# Patient Record
Sex: Female | Born: 1955 | Race: Black or African American | Hispanic: No | Marital: Married | Smoking: Never smoker
Health system: Southern US, Community
[De-identification: ages and names within clinical notes are randomized; demographics above are authoritative.]

## PROBLEM LIST (undated history)

## (undated) ENCOUNTER — Emergency Department (HOSPITAL_COMMUNITY): Admission: EM | Payer: Self-pay | Source: Home / Self Care

## (undated) DIAGNOSIS — R05 Cough: Secondary | ICD-10-CM

## (undated) DIAGNOSIS — R519 Headache, unspecified: Secondary | ICD-10-CM

## (undated) DIAGNOSIS — I10 Essential (primary) hypertension: Secondary | ICD-10-CM

## (undated) DIAGNOSIS — R011 Cardiac murmur, unspecified: Secondary | ICD-10-CM

## (undated) DIAGNOSIS — E7800 Pure hypercholesterolemia, unspecified: Secondary | ICD-10-CM

## (undated) DIAGNOSIS — R0789 Other chest pain: Secondary | ICD-10-CM

## (undated) DIAGNOSIS — R058 Other specified cough: Secondary | ICD-10-CM

## (undated) DIAGNOSIS — R51 Headache: Secondary | ICD-10-CM

## (undated) HISTORY — DX: Cardiac murmur, unspecified: R01.1

## (undated) HISTORY — DX: Cough: R05

## (undated) HISTORY — DX: Headache, unspecified: R51.9

## (undated) HISTORY — PX: CYSTECTOMY: SUR359

## (undated) HISTORY — DX: Other chest pain: R07.89

## (undated) HISTORY — DX: Headache: R51

## (undated) HISTORY — DX: Essential (primary) hypertension: I10

## (undated) HISTORY — DX: Other specified cough: R05.8

---

## 2001-09-03 ENCOUNTER — Encounter: Payer: Self-pay | Admitting: *Deleted

## 2001-09-03 ENCOUNTER — Emergency Department (HOSPITAL_COMMUNITY): Admission: EM | Admit: 2001-09-03 | Discharge: 2001-09-03 | Payer: Self-pay | Admitting: *Deleted

## 2005-10-01 ENCOUNTER — Ambulatory Visit (HOSPITAL_COMMUNITY): Admission: RE | Admit: 2005-10-01 | Discharge: 2005-10-01 | Payer: Self-pay | Admitting: Family Medicine

## 2006-02-24 ENCOUNTER — Ambulatory Visit: Payer: Self-pay

## 2006-08-02 ENCOUNTER — Ambulatory Visit (HOSPITAL_COMMUNITY): Admission: RE | Admit: 2006-08-02 | Discharge: 2006-08-02 | Payer: Self-pay | Admitting: Family Medicine

## 2007-05-02 ENCOUNTER — Ambulatory Visit: Payer: Self-pay

## 2007-05-31 ENCOUNTER — Ambulatory Visit: Payer: Self-pay | Admitting: Gastroenterology

## 2007-05-31 ENCOUNTER — Ambulatory Visit (HOSPITAL_COMMUNITY): Admission: RE | Admit: 2007-05-31 | Discharge: 2007-05-31 | Payer: Self-pay | Admitting: Gastroenterology

## 2007-06-19 ENCOUNTER — Other Ambulatory Visit: Admission: RE | Admit: 2007-06-19 | Discharge: 2007-06-19 | Payer: Self-pay | Admitting: Obstetrics and Gynecology

## 2009-06-18 ENCOUNTER — Ambulatory Visit: Payer: Self-pay

## 2011-02-09 NOTE — Op Note (Signed)
Shelley Bautista, Shelley Bautista               ACCOUNT NO.:  [id number]   MEDICAL RECORD NO.:  [id number]          PATIENT TYPE:  AMB   LOCATION:  DAY                           FACILITY:  [facility]   PHYSICIAN:  Kassie Mends, M.D.      DATE OF BIRTH:  1956/06/15   DATE OF PROCEDURE:  05/31/2007  DATE OF DISCHARGE:                               OPERATIVE REPORT   PROCEDURE:  Colonoscopy.   INDICATION FOR EXAM:  Shelley Bautista is a 55 year old female who presents  for average risk colon cancer screening.   FINDINGS:  1. Scattered diverticula throughout the colon.  Otherwise, no polyps,      masses, inflammatory changes, or AVMs.  2. Normal retroflexed view of the rectum.   DIAGNOSES:  Mild pancolonic diverticulosis.   RECOMMENDATIONS:  1. Screening colonoscopy in 10 years.  2. She should follow a high-fiber diet.  She was given a handout on      high-fiber diet and diverticulosis.   MEDICATIONS:  1. Demerol 75 mg IV.  2. Versed 4 mg IV.   PROCEDURE TECHNIQUE:  The physical exam was performed, and informed  consent was obtained from the patient after explaining the benefits,  risks and alternatives to the procedure.  The patient was connected to  the monitor and placed in the left lateral position.  Continuous oxygen  was provided by nasal cannula and IV medicine was administered through  an indwelling cannula.  After administration of sedation and rectal  exam, the patient's  rectum was intubated and the scope was advanced under direct  visualization to the cecum.  The scope was removed slowly by carefully  examining the color, texture, anatomy, and integrity of the mucosa on  the way out.  The patient was recovering in endoscopy and was discharged  home in satisfactory condition.      Kassie Mends, M.D.  Electronically Signed     SM/MEDQ  D:  05/31/2007  T:  05/31/2007  Job:  [id number]   cc:   Melony Overly, PA   Patrica Duel, M.D.  Fax: [phone]

## 2012-11-15 ENCOUNTER — Other Ambulatory Visit (HOSPITAL_COMMUNITY): Payer: Self-pay | Admitting: Family Medicine

## 2012-11-15 DIAGNOSIS — Z139 Encounter for screening, unspecified: Secondary | ICD-10-CM

## 2012-11-20 ENCOUNTER — Ambulatory Visit (HOSPITAL_COMMUNITY): Payer: Self-pay

## 2012-11-24 ENCOUNTER — Ambulatory Visit (HOSPITAL_COMMUNITY): Payer: Self-pay

## 2013-03-14 ENCOUNTER — Other Ambulatory Visit (HOSPITAL_COMMUNITY): Payer: Self-pay | Admitting: Internal Medicine

## 2013-03-14 DIAGNOSIS — Z139 Encounter for screening, unspecified: Secondary | ICD-10-CM

## 2013-03-19 ENCOUNTER — Ambulatory Visit (HOSPITAL_COMMUNITY): Payer: Self-pay

## 2013-10-01 ENCOUNTER — Other Ambulatory Visit (HOSPITAL_COMMUNITY): Payer: Self-pay | Admitting: Family Medicine

## 2013-10-01 DIAGNOSIS — Z1231 Encounter for screening mammogram for malignant neoplasm of breast: Secondary | ICD-10-CM

## 2013-10-04 ENCOUNTER — Ambulatory Visit (HOSPITAL_COMMUNITY)
Admission: RE | Admit: 2013-10-04 | Discharge: 2013-10-04 | Disposition: A | Discharge status: Home / Self Care | Payer: Managed Care, Other (non HMO) | Source: Ambulatory Visit | Attending: Family Medicine | Admitting: Family Medicine

## 2013-10-04 DIAGNOSIS — Z1231 Encounter for screening mammogram for malignant neoplasm of breast: Secondary | ICD-10-CM | POA: Insufficient documentation

## 2013-10-08 ENCOUNTER — Ambulatory Visit (HOSPITAL_COMMUNITY): Payer: Self-pay

## 2014-07-09 ENCOUNTER — Encounter (INDEPENDENT_AMBULATORY_CARE_PROVIDER_SITE_OTHER): Payer: Self-pay

## 2014-07-09 ENCOUNTER — Encounter: Payer: Self-pay | Admitting: Neurology

## 2014-07-09 ENCOUNTER — Ambulatory Visit (INDEPENDENT_AMBULATORY_CARE_PROVIDER_SITE_OTHER): Payer: Managed Care, Other (non HMO) | Admitting: Neurology

## 2014-07-09 VITALS — BP 141/84 | HR 67 | Ht 59.0 in | Wt 146.0 lb

## 2014-07-09 DIAGNOSIS — G609 Hereditary and idiopathic neuropathy, unspecified: Secondary | ICD-10-CM

## 2014-07-09 DIAGNOSIS — G43109 Migraine with aura, not intractable, without status migrainosus: Secondary | ICD-10-CM

## 2014-07-09 DIAGNOSIS — G453 Amaurosis fugax: Secondary | ICD-10-CM

## 2014-07-09 NOTE — Patient Instructions (Signed)
Overall you are doing fairly well but I do want to suggest a few things today:   Remember to drink plenty of fluid, eat healthy meals and do not skip any meals. Try to eat protein with a every meal and eat a healthy snack such as fruit or nuts in between meals. Try to keep a regular sleep-wake schedule and try to exercise daily, particularly in the form of walking, 20-30 minutes a day, if you can.   As far as your medications are concerned, I would like to suggest: Daily aspirin 81mg   As far as diagnostic testing: MRI and MRA to evaluate brain and arteries, labwork fasting  I would like to see you back after workup is complete, sooner if we need to. Please call us with any interim questions, concerns, problems, updates or refill requests.   Please also call us for any test results so we can go over those with you on the phone.  My clinical assistant and will answer any of your questions and relay your messages to me and also relay most of my messages to you.   Our phone number is [phone]. We also have an after hours call service for urgent matters and there is a physician on-call for urgent questions. For any emergencies you know to call 911 or go to the nearest emergency room

## 2014-07-09 NOTE — Progress Notes (Addendum)
[id number]    Provider:  Dr Jaynee Eagles Referring Provider: Johna Sheriff, MD Primary Care Physician:  No primary provider on file.  CC:  Vision changes  HPI:  Shelley Bautista is a 58 y.o. female here as a referral from Dr. Herbert Deaner for Vision changes  58 year old female PMHx HTN, DM who has some vision changes, like heat coming off of the road. The whole eye gets blurry. Lasts for 10 minutes. No pain, that is the only symptom. A lot of flashing lights, dots. She can't see when this happens, vision gets very blurry. No headache at the time. Last time was 3 weeks ago. Has happened 5x in the last few months. 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 Same at home. Happens in both eyes, never at the same time. Never loss of vision just very blurry to the point can't even read. Starts acutely but then worsens over the few minutes until very blurry. No double vision. No vision loss. No trauma. No jaw tightness, no pain in the shoulder or hip girdle. The last time she had a headache was last month, 4x a year, pounding behind the eyes, does get blurry but not the same, has light sensitivity but no sound sensitivity and no nausea. Headaches get bad, 10/10, they last for 3 days, they just go away at that point. OTC ibuprofen doesn't help. No weakness, no numbness or tingling, no dizzyness, no cp, not light headed during headaches.   Reviewed notes, labs and imaging from outside physicians, which showed: Opthamology consult demonstrated a normal dilated exam, possible glaucoma.  Review of Systems: Patient complains of symptoms per HPI as well as the following symptoms: blurred vision, eye pain, headache, sleepiness, snoring, shift work, joint pain, aching muscles, not enough sleep. Pertinent negatives per HPI. All others negative.   History   Social History  . Marital Status: Married    Spouse Name: Milbert Coulter    Number of Children: 4  . Years of  Education: GED   Occupational History  .      [employer]   Social History Main Topics  . Smoking status: Never Smoker   . Smokeless tobacco: Never Used  . Alcohol Use: Yes     Comment: 3 drinks  . Drug Use: No  . Sexual Activity: Not on file   Other Topics Concern  . Not on file   Social History Narrative   Patient lives at home with her family.   Caffeine use: 2 drinks daily    Family History  Problem Relation Age of Onset  . Cancer Mother   . Other Father     blood clot    Past Medical History  Diagnosis Date  . Hypertension     Past Surgical History  Procedure Laterality Date  . Cystectomy      Ovary    Current Outpatient Prescriptions  Medication Sig Dispense Refill  . estrogens, conjugated, (PREMARIN) 0.625 MG tablet Take 0.625 mg by mouth daily. Take daily for 21 days then do not take for 7 days.      Marland Kitchen losartan-hydrochlorothiazide (HYZAAR) 100-25 MG per tablet Take 1 tablet by mouth 2 (two) times daily.       No current facility-administered medications for this visit.    Allergies as of 07/09/2014  . (No Known Allergies)    Vitals: BP 141/84  Pulse 67  Ht 4\' 11"  (1.499 m)  Wt 146 lb (66.225 kg)  BMI 29.47 kg/m2 Last Weight:  Wt Readings from Last 1 Encounters:  07/09/14 146 lb (66.225 kg)   Last Height:   Ht Readings from Last 1 Encounters:  07/09/14 4\' 11"  (1.499 m)   Physical exam: Exam: Gen: NAD, conversant, well nourised, well groomed                     CV: RRR, no MRG. No Carotid Bruits. No peripheral edema, warm, nontender Eyes: Conjunctivae clear without exudates or hemorrhage  Neuro: Detailed Neurologic Exam  Speech:    Speech is normal; fluent and spontaneous with normal comprehension.  Cognition:    The patient is oriented to person, place, and time;     recent and remote memory intact;     language fluent;     normal attention, concentration,     fund of knowledge Cranial Nerves:    The pupils are equal,  round, and reactive to light. The fundi are normal and spontaneous venous pulsations are present. Visual fields are full to finger confrontation. Extraocular movements are intact. Trigeminal sensation is intact and the muscles of mastication are normal. The face is symmetric. The palate elevates in the midline. Voice is normal. Shoulder shrug is normal. The tongue has normal motion without fasciculations.   Coordination:    Normal finger to nose and heel to shin. Normal rapid alternating movements.   Gait:    Heel-toe and tandem gait are normal.   Motor Observation:    No asymmetry, no atrophy, and no involuntary movements noted. Tone:    Normal muscle tone.    Posture:    Posture is normal. normal erect    Strength:    Strength is V/V in the upper and lower limbs.      Sensation:   decrease distally to pp and temp  Reflex Exam:  DTR's:    Deep tendon reflexes in the upper and lower extremities are normal bilaterally.   Toes:    The toes are downgoing bilaterally.   Clonus:    Clonus is absent.   Assessment/Plan:  Patient with a PMHx of migraines, HTN and Transient binocular visual loss. Vision gets blurry and she sees stars, prisms. Likely is an aura without the migraine. Neuro exam normal, no papilledema. Since it is binocular (however never together at once) is less likely due to thromboembolism or ischemia however she does have vascular risk factors so will order labs as well as imaging to rule out stroke(MRI Brain, Carotid Dopplers). Labs for DM and HLD. No associated symptoms to suggest vertebrobasilar disease (no dizziness, vertigo). Need to rule out GCA as well. Will do a baseline  EKG as well. Will order a serum study for peripheral neuropathy. Advised her to start a daily baby aspirin.   Sarina Ill, MD  [facility] [address] [place], [place] [address]  Phone [phone] Fax [phone]

## 2014-07-12 ENCOUNTER — Encounter: Payer: Self-pay | Admitting: Neurology

## 2014-07-12 DIAGNOSIS — G453 Amaurosis fugax: Secondary | ICD-10-CM | POA: Insufficient documentation

## 2014-07-12 DIAGNOSIS — G43909 Migraine, unspecified, not intractable, without status migrainosus: Secondary | ICD-10-CM | POA: Insufficient documentation

## 2014-07-15 ENCOUNTER — Ambulatory Visit (HOSPITAL_COMMUNITY)
Admission: RE | Admit: 2014-07-15 | Discharge: 2014-07-15 | Disposition: A | Discharge status: Home / Self Care | Payer: Managed Care, Other (non HMO) | Source: Ambulatory Visit | Attending: Neurology | Admitting: Neurology

## 2014-07-15 ENCOUNTER — Telehealth: Payer: Self-pay | Admitting: Neurology

## 2014-07-15 DIAGNOSIS — I10 Essential (primary) hypertension: Secondary | ICD-10-CM | POA: Diagnosis not present

## 2014-07-15 DIAGNOSIS — G43909 Migraine, unspecified, not intractable, without status migrainosus: Secondary | ICD-10-CM | POA: Insufficient documentation

## 2014-07-15 DIAGNOSIS — G453 Amaurosis fugax: Secondary | ICD-10-CM | POA: Diagnosis not present

## 2014-07-15 NOTE — Telephone Encounter (Signed)
Dee from [facility] calling to state that patient is there for an MRA right now, but they see that there are 2 orders and they wanted to make sure they get it cleared up before patient leaves, please return call and advise.

## 2014-07-15 NOTE — Telephone Encounter (Signed)
Message was fwd to Dr. Jaynee Eagles via [place].

## 2016-05-18 DIAGNOSIS — Z1389 Encounter for screening for other disorder: Secondary | ICD-10-CM | POA: Diagnosis not present

## 2016-05-18 DIAGNOSIS — Z6827 Body mass index (BMI) 27.0-27.9, adult: Secondary | ICD-10-CM | POA: Diagnosis not present

## 2016-05-18 DIAGNOSIS — R591 Generalized enlarged lymph nodes: Secondary | ICD-10-CM | POA: Diagnosis not present

## 2016-05-18 DIAGNOSIS — G4700 Insomnia, unspecified: Secondary | ICD-10-CM | POA: Diagnosis not present

## 2016-05-18 DIAGNOSIS — E663 Overweight: Secondary | ICD-10-CM | POA: Diagnosis not present

## 2017-02-04 DIAGNOSIS — E782 Mixed hyperlipidemia: Secondary | ICD-10-CM | POA: Diagnosis not present

## 2017-02-04 DIAGNOSIS — F419 Anxiety disorder, unspecified: Secondary | ICD-10-CM | POA: Diagnosis not present

## 2017-02-04 DIAGNOSIS — Z1389 Encounter for screening for other disorder: Secondary | ICD-10-CM | POA: Diagnosis not present

## 2017-02-04 DIAGNOSIS — Z6830 Body mass index (BMI) 30.0-30.9, adult: Secondary | ICD-10-CM | POA: Diagnosis not present

## 2017-02-04 DIAGNOSIS — R2243 Localized swelling, mass and lump, lower limb, bilateral: Secondary | ICD-10-CM | POA: Diagnosis not present

## 2017-02-04 DIAGNOSIS — I10 Essential (primary) hypertension: Secondary | ICD-10-CM | POA: Diagnosis not present

## 2017-02-17 DIAGNOSIS — M722 Plantar fascial fibromatosis: Secondary | ICD-10-CM | POA: Diagnosis not present

## 2017-02-17 DIAGNOSIS — M79672 Pain in left foot: Secondary | ICD-10-CM | POA: Diagnosis not present

## 2017-02-17 DIAGNOSIS — M79671 Pain in right foot: Secondary | ICD-10-CM | POA: Diagnosis not present

## 2017-05-02 ENCOUNTER — Telehealth: Payer: Self-pay | Admitting: Gastroenterology

## 2017-05-02 NOTE — Telephone Encounter (Signed)
Sept recall for tcs °

## 2017-05-03 NOTE — Telephone Encounter (Signed)
Letter mailed to pt.  

## 2017-08-02 ENCOUNTER — Telehealth: Payer: Self-pay

## 2017-08-02 NOTE — Telephone Encounter (Signed)
Letter reminder for next colonoscopy returned. I called pt and updated her address. She is under the weather at this time, but will call when she is ready to schedule.

## 2017-08-26 DIAGNOSIS — R05 Cough: Secondary | ICD-10-CM | POA: Diagnosis not present

## 2017-08-26 DIAGNOSIS — Z6831 Body mass index (BMI) 31.0-31.9, adult: Secondary | ICD-10-CM | POA: Diagnosis not present

## 2017-08-26 DIAGNOSIS — Z1389 Encounter for screening for other disorder: Secondary | ICD-10-CM | POA: Diagnosis not present

## 2017-08-26 DIAGNOSIS — Z23 Encounter for immunization: Secondary | ICD-10-CM | POA: Diagnosis not present

## 2017-08-26 DIAGNOSIS — J8489 Other specified interstitial pulmonary diseases: Secondary | ICD-10-CM | POA: Diagnosis not present

## 2017-08-26 DIAGNOSIS — E6609 Other obesity due to excess calories: Secondary | ICD-10-CM | POA: Diagnosis not present

## 2017-11-15 DIAGNOSIS — Z1389 Encounter for screening for other disorder: Secondary | ICD-10-CM | POA: Diagnosis not present

## 2017-11-15 DIAGNOSIS — J209 Acute bronchitis, unspecified: Secondary | ICD-10-CM | POA: Diagnosis not present

## 2017-11-15 DIAGNOSIS — J22 Unspecified acute lower respiratory infection: Secondary | ICD-10-CM | POA: Diagnosis not present

## 2017-11-15 DIAGNOSIS — E6609 Other obesity due to excess calories: Secondary | ICD-10-CM | POA: Diagnosis not present

## 2017-11-15 DIAGNOSIS — Z6830 Body mass index (BMI) 30.0-30.9, adult: Secondary | ICD-10-CM | POA: Diagnosis not present

## 2017-11-25 DIAGNOSIS — M25512 Pain in left shoulder: Secondary | ICD-10-CM | POA: Diagnosis not present

## 2017-11-25 DIAGNOSIS — Z0001 Encounter for general adult medical examination with abnormal findings: Secondary | ICD-10-CM | POA: Diagnosis not present

## 2017-11-25 DIAGNOSIS — R011 Cardiac murmur, unspecified: Secondary | ICD-10-CM | POA: Diagnosis not present

## 2017-11-25 DIAGNOSIS — Z6830 Body mass index (BMI) 30.0-30.9, adult: Secondary | ICD-10-CM | POA: Diagnosis not present

## 2017-11-25 DIAGNOSIS — E6609 Other obesity due to excess calories: Secondary | ICD-10-CM | POA: Diagnosis not present

## 2017-11-25 DIAGNOSIS — Z1389 Encounter for screening for other disorder: Secondary | ICD-10-CM | POA: Diagnosis not present

## 2017-11-25 DIAGNOSIS — R05 Cough: Secondary | ICD-10-CM | POA: Diagnosis not present

## 2017-12-30 ENCOUNTER — Encounter: Payer: Self-pay | Admitting: Cardiology

## 2017-12-30 ENCOUNTER — Ambulatory Visit (INDEPENDENT_AMBULATORY_CARE_PROVIDER_SITE_OTHER): Payer: BLUE CROSS/BLUE SHIELD | Admitting: Cardiology

## 2017-12-30 VITALS — BP 138/92 | HR 74 | Ht 59.0 in | Wt 148.0 lb

## 2017-12-30 DIAGNOSIS — R011 Cardiac murmur, unspecified: Secondary | ICD-10-CM | POA: Diagnosis not present

## 2017-12-30 DIAGNOSIS — R55 Syncope and collapse: Secondary | ICD-10-CM | POA: Diagnosis not present

## 2017-12-30 NOTE — Patient Instructions (Signed)
Your physician recommends that you schedule a follow-up appointment in:  Pending after echo    Your physician has requested that you have an echocardiogram. Echocardiography is a painless test that uses sound waves to create images of your heart. It provides your doctor with information about the size and shape of your heart and how well your heart's chambers and valves are working. This procedure takes approximately one hour. There are no restrictions for this procedure.  Use over the counter Zantac 150 mg twice a day    No changes with your medications    No lab work today      Thank you for choosing [facility] !

## 2017-12-30 NOTE — Progress Notes (Signed)
Clinical Summary Shelley Bautista is a 62 y.o.female seen as new consult, referred by [facility] for heart murmur  1. Heart murmur - new diagnosis - reports some SOB at times with activitiy. 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 Burning like pain mid chest, 7/10 in severity. Can occur at any time. No other associated symptoms. Worst with deep breaths. Lasts a few minutes. Happens about 2-3 times a week. Has used antacid with some benefit. Often cough at night with laying down.  - can have some coughing/wheezing at times.    2,. Syncope - occurred at home while sitting. Had significant URI. Severe cough, stood up to go to bathroom and blacked out.     Past Medical History:  Diagnosis Date  . Headache   . Hypertension      No Known Allergies   Current Outpatient Medications  Medication Sig Dispense Refill  . estrogens, conjugated, (PREMARIN) 0.625 MG tablet Take 0.625 mg by mouth daily. Take daily for 21 days then do not take for 7 days.    Marland Kitchen losartan-hydrochlorothiazide (HYZAAR) 100-25 MG per tablet Take 1 tablet by mouth 2 (two) times daily.     No current facility-administered medications for this visit.      Past Surgical History:  Procedure Laterality Date  . CYSTECTOMY     Ovary     No Known Allergies    Family History  Problem Relation Age of Onset  . Cancer Mother   . Other Father        blood clot     Social History Shelley Bautista reports that she has never smoked. She has never used smokeless tobacco. Shelley Bautista reports that she drinks alcohol.   Review of Systems CONSTITUTIONAL: No weight loss, fever, chills, weakness or fatigue.  HEENT: Eyes: No visual loss, blurred vision, double vision or yellow sclerae.No hearing loss, sneezing, congestion, runny nose or sore throat.  SKIN: No rash or itching.  CARDIOVASCULAR: per hpi RESPIRATORY: No shortness of breath, cough or sputum.  GASTROINTESTINAL: No anorexia,  nausea, vomiting or diarrhea. No abdominal pain or blood.  GENITOURINARY: No burning on urination, no polyuria NEUROLOGICAL: per hpi  MUSCULOSKELETAL: No muscle, back pain, joint pain or stiffness.  LYMPHATICS: No enlarged nodes. No history of splenectomy.  PSYCHIATRIC: No history of depression or anxiety.  ENDOCRINOLOGIC: No reports of sweating, cold or heat intolerance. No polyuria or polydipsia.  Marland Kitchen   Physical Examination Vitals:   12/30/17 0847 12/30/17 0849  BP: (!) 146/88 (!) 138/92  Pulse: 74   SpO2: 98%    Vitals:   12/30/17 0847  Weight: 148 lb (67.1 kg)  Height: 4\' 11"  (1.499 m)    Gen: resting comfortably, no acute distress HEENT: no scleral icterus, pupils equal round and reactive, no palptable cervical adenopathy,  CV: RRR, 2/6 systolic murmur rusb, no jvd Resp: Clear to auscultation bilaterally GI: abdomen is soft, non-tender, non-distended, normal bowel sounds, no hepatosplenomegaly MSK: extremities are warm, no edema.  Skin: warm, no rash Neuro:  no focal deficits Psych: appropriate affect     Assessment and Plan  1. Heart murmur - we will obtain an echo to further evaluate  2. Syncope - post- tussive syncope, with likely orthostatic component in setting of URI - EKG in clinic today shows NSR - f/u echo due to heart murmur, no other workup planned at this time.   3. Chest pain - atypical, most consistent with  GERD. Recommend trial of OTC zantac.  - f/u echo results, do not plan on ischemic testing at this time.     Arnoldo Lenis, M.D.,

## 2018-01-02 ENCOUNTER — Ambulatory Visit (HOSPITAL_COMMUNITY)
Admission: RE | Admit: 2018-01-02 | Discharge: 2018-01-02 | Disposition: A | Discharge status: Home / Self Care | Payer: BLUE CROSS/BLUE SHIELD | Source: Ambulatory Visit | Attending: Cardiology | Admitting: Cardiology

## 2018-01-02 DIAGNOSIS — I10 Essential (primary) hypertension: Secondary | ICD-10-CM | POA: Diagnosis not present

## 2018-01-02 DIAGNOSIS — G453 Amaurosis fugax: Secondary | ICD-10-CM | POA: Diagnosis not present

## 2018-01-02 DIAGNOSIS — R011 Cardiac murmur, unspecified: Secondary | ICD-10-CM | POA: Diagnosis not present

## 2018-01-02 NOTE — Progress Notes (Signed)
*  PRELIMINARY RESULTS* Echocardiogram 2D Echocardiogram has been performed.  Shelley Bautista 01/02/2018, 9:08 AM

## 2018-01-03 ENCOUNTER — Emergency Department (HOSPITAL_COMMUNITY)
Admission: EM | Admit: 2018-01-03 | Discharge: 2018-01-03 | Disposition: A | Discharge status: Home / Self Care | Payer: BLUE CROSS/BLUE SHIELD | Attending: Emergency Medicine | Admitting: Emergency Medicine

## 2018-01-03 ENCOUNTER — Encounter (HOSPITAL_COMMUNITY): Payer: Self-pay | Admitting: Emergency Medicine

## 2018-01-03 ENCOUNTER — Emergency Department (HOSPITAL_COMMUNITY): Payer: BLUE CROSS/BLUE SHIELD

## 2018-01-03 ENCOUNTER — Other Ambulatory Visit: Payer: Self-pay

## 2018-01-03 DIAGNOSIS — I10 Essential (primary) hypertension: Secondary | ICD-10-CM | POA: Diagnosis not present

## 2018-01-03 DIAGNOSIS — R079 Chest pain, unspecified: Secondary | ICD-10-CM | POA: Diagnosis not present

## 2018-01-03 DIAGNOSIS — R0602 Shortness of breath: Secondary | ICD-10-CM | POA: Diagnosis not present

## 2018-01-03 DIAGNOSIS — Z79899 Other long term (current) drug therapy: Secondary | ICD-10-CM | POA: Insufficient documentation

## 2018-01-03 DIAGNOSIS — R0789 Other chest pain: Secondary | ICD-10-CM | POA: Diagnosis not present

## 2018-01-03 DIAGNOSIS — N644 Mastodynia: Secondary | ICD-10-CM | POA: Diagnosis not present

## 2018-01-03 LAB — COMPREHENSIVE METABOLIC PANEL
ALBUMIN: 3.3 g/dL — AB (ref 3.5–5.0)
ALK PHOS: 53 U/L (ref 38–126)
ALT: 35 U/L (ref 14–54)
AST: 22 U/L (ref 15–41)
Anion gap: 9 (ref 5–15)
BUN: 20 mg/dL (ref 6–20)
CALCIUM: 8.4 mg/dL — AB (ref 8.9–10.3)
CHLORIDE: 105 mmol/L (ref 101–111)
CO2: 25 mmol/L (ref 22–32)
CREATININE: 0.87 mg/dL (ref 0.44–1.00)
GFR calc non Af Amer: >60 mL/min (ref >60)
GLUCOSE: 108 mg/dL — AB (ref 65–99)
Potassium: 3.5 mmol/L (ref 3.5–5.1)
SODIUM: 139 mmol/L (ref 135–145)
Total Bilirubin: 0.5 mg/dL (ref 0.3–1.2)
Total Protein: 6.6 g/dL (ref 6.5–8.1)

## 2018-01-03 LAB — CBC WITH DIFFERENTIAL/PLATELET
BASOS PCT: 0 %
Basophils Absolute: 0 10*3/uL (ref 0.0–0.1)
EOS ABS: 0.1 10*3/uL (ref 0.0–0.7)
Eosinophils Relative: 1 %
HCT: 37.4 % (ref 36.0–46.0)
HEMOGLOBIN: 12.1 g/dL (ref 12.0–15.0)
LYMPHS ABS: 1.7 10*3/uL (ref 0.7–4.0)
Lymphocytes Relative: 27 %
MCH: 29.7 pg (ref 26.0–34.0)
MCHC: 32.4 g/dL (ref 30.0–36.0)
MCV: 91.9 fL (ref 78.0–100.0)
MONO ABS: 0.5 10*3/uL (ref 0.1–1.0)
MONOS PCT: 8 %
NEUTROS PCT: 64 %
Neutro Abs: 3.9 10*3/uL (ref 1.7–7.7)
Platelets: 266 10*3/uL (ref 150–400)
RBC: 4.07 MIL/uL (ref 3.87–5.11)
RDW: 13.5 % (ref 11.5–15.5)
WBC: 6.1 10*3/uL (ref 4.0–10.5)

## 2018-01-03 LAB — TROPONIN I: Troponin I: <0.03 ng/mL (ref <0.03)

## 2018-01-03 LAB — D-DIMER, QUANTITATIVE (NOT AT ARMC)

## 2018-01-03 MED ORDER — NITROGLYCERIN 0.4 MG SL SUBL: 0.4000 mg | SUBLINGUAL_TABLET | SUBLINGUAL | 0 refills | Status: AC | PRN

## 2018-01-03 NOTE — ED Triage Notes (Addendum)
While at work pt had sudden onset of left sided chest pain. She denies any chest pain at this time. Pt was given 324mg  of aspirin and 1 nitro by ems.

## 2018-01-03 NOTE — ED Provider Notes (Signed)
[facility] EMERGENCY DEPARTMENT Provider Note   CSN: [id number] Arrival date & time: 01/03/18  0147  Time seen 02:10 AM   History   Chief Complaint Chief Complaint  Patient presents with  . Chest Pain    HPI Shelley Bautista is a 62 y.o. female.  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  She states in total the pain lasted about 15 minutes.  She states she was still having the pain on EMS arrival and within 1-2 minutes of her receiving nitroglycerin the pain was totally gone and is still gone.  She had some mild shortness of breath but denies diaphoresis, nausea, vomiting, or radiation of the pain.  She states she did get shaky and felt cold when she had the pain.  She denies any [place] or swelling of her legs.  She states she has never had this pain before.  She denies any recent traveling.  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  She states nothing was different today other than she was recently noted to have a heart murmur on her physical about 2 months ago.  She is been evaluated by Dr. Harl Bowie, cardiologist and had a echocardiogram done this morning when she got off work.  She denies being stressed out about the results.  Her risk factors for coronary artery disease are hypertension and high cholesterol.  She however does not have diabetes, she does not smoke, and she has no family history coronary artery disease.  [facility] Cardiology Dr Harl Bowie  Past Medical History:  Diagnosis Date  . Headache   . Hypertension     Patient Active Problem List   Diagnosis Date Noted  . 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 Migraine 07/12/2014    Past Surgical History:  Procedure Laterality Date  . CYSTECTOMY     Ovary      OB History   None      Home Medications    Prior to Admission medications   Medication Sig Start Date End Date Taking? Authorizing Provider  amLODipine (NORVASC) 2.5 MG tablet Take 2.5 mg by mouth daily. 09/27/17   [provider]  atorvastatin (LIPITOR) 20 MG tablet Take 20 mg by mouth daily. 11/25/17   [provider]  estrogens, conjugated, (PREMARIN) 0.625 MG tablet Take 0.625 mg by mouth daily. Take daily for 21 days then do not take for 7 days.    [provider]  losartan-hydrochlorothiazide (HYZAAR) 100-25 MG per tablet Take 1 tablet by mouth 2 (two) times daily.    [provider]  nitroGLYCERIN (NITROSTAT) 0.4 MG SL tablet Place 1 tablet (0.4 mg total) under the tongue every 5 (five) minutes as needed for chest pain. 01/03/18   Rolland Porter, MD    Family History Family History  Problem Relation Age of Onset  . Cancer Mother   . Other Father        blood clot    Social History Social History   Tobacco Use  . Smoking status: Never Smoker  . Smokeless tobacco: Never Used  Substance Use Topics  . Alcohol use: Yes    Comment: 3 drinks  . Drug use: No  employed   Allergies  Patient has no known allergies.   Review of Systems Review of Systems  All other systems reviewed and are negative.    Physical Exam Updated Vital Signs BP (!) 141/82   Pulse 64   Temp 98.3 F (36.8 C)   Resp 16   Ht 4\' 11"  (1.499 m)   Wt 67.1 kg (148 lb)   SpO2 98%   BMI 29.89 kg/m   Vital signs normal    Physical Exam  Constitutional: She is oriented to person, place, and time. She appears well-developed and well-nourished.  Non-toxic appearance. She does not appear ill. No distress.  HENT:  Head: Normocephalic and atraumatic.  Right Ear: External ear normal.  Left Ear: External ear normal.  Nose: Nose normal. No mucosal edema or rhinorrhea.  Mouth/Throat: Oropharynx is clear and moist and mucous membranes are normal. No dental  abscesses or uvula swelling.  Eyes: Pupils are equal, round, and reactive to light. Conjunctivae and EOM are normal.  Neck: Normal range of motion and full passive range of motion without pain. Neck supple.  Cardiovascular: Normal rate, regular rhythm and normal heart sounds. Exam reveals no gallop and no friction rub.  No murmur heard. Pulmonary/Chest: Effort normal and breath sounds normal. No respiratory distress. She has no wheezes. She has no rhonchi. She has no rales. She exhibits no tenderness and no crepitus.  Area of chest pain    Abdominal: Soft. Normal appearance and bowel sounds are normal. She exhibits no distension. There is no tenderness. There is no rebound and no guarding.  Musculoskeletal: Normal range of motion. She exhibits no edema or tenderness.  Moves all extremities well.   Neurological: She is alert and oriented to person, place, and time. She has normal strength. No cranial nerve deficit.  Skin: Skin is warm, dry and intact. No rash noted. No erythema. No pallor.  Psychiatric: She has a normal mood and affect. Her speech is normal and behavior is normal. Her mood appears not anxious.  Nursing note and vitals reviewed.    ED Treatments / Results  Labs (all labs ordered are listed, but only abnormal results are displayed) Results for orders placed or performed during the hospital encounter of 01/03/18  Comprehensive metabolic panel  Result Value Ref Range   Sodium 139 135 - 145 mmol/L   Potassium 3.5 3.5 - 5.1 mmol/L   Chloride 105 101 - 111 mmol/L   CO2 25 22 - 32 mmol/L   Glucose, Bld 108 (H) 65 - 99 mg/dL   BUN 20 6 - 20 mg/dL   Creatinine, Ser 0.87 0.44 - 1.00 mg/dL   Calcium 8.4 (L) 8.9 - 10.3 mg/dL   Total Protein 6.6 6.5 - 8.1 g/dL   Albumin 3.3 (L) 3.5 - 5.0 g/dL   AST 22 15 - 41 U/L   ALT 35 14 - 54 U/L   Alkaline Phosphatase 53 38 - 126 U/L   Total Bilirubin 0.5 0.3 - 1.2 mg/dL   GFR calc non Af Amer >60 >60 mL/min   GFR calc Af Amer >60 >60  mL/min   Anion gap 9 5 - 15  CBC with Differential  Result Value Ref Range   WBC 6.1 4.0 - 10.5 K/uL   RBC 4.07 3.87 - 5.11 MIL/uL   Hemoglobin 12.1 12.0 - 15.0 g/dL   HCT 37.4 36.0 - 46.0 %   MCV 91.9 78.0 - 100.0 fL   MCH 29.7 26.0 - 34.0 pg   MCHC 32.4 30.0 - 36.0  g/dL   RDW 13.5 11.5 - 15.5 %   Platelets 266 150 - 400 K/uL   Neutrophils Relative % 64 %   Neutro Abs 3.9 1.7 - 7.7 K/uL   Lymphocytes Relative 27 %   Lymphs Abs 1.7 0.7 - 4.0 K/uL   Monocytes Relative 8 %   Monocytes Absolute 0.5 0.1 - 1.0 K/uL   Eosinophils Relative 1 %   Eosinophils Absolute 0.1 0.0 - 0.7 K/uL   Basophils Relative 0 %   Basophils Absolute 0.0 0.0 - 0.1 K/uL  Troponin I  Result Value Ref Range   Troponin I <0.03 <0.03 ng/mL  D-dimer, quantitative  Result Value Ref Range   D-Dimer, Quant <0.27 0.00 - 0.50 ug/mL-FEU  Troponin I  Result Value Ref Range   Troponin I <0.03 <0.03 ng/mL    Laboratory interpretation all normal     EKG EKG Interpretation  Date/Time:  Tuesday January 03 2018 01:58:37 EDT Ventricular Rate:  70 PR Interval:    QRS Duration: 90 QT Interval:  439 QTC Calculation: 474 R Axis:   -6 Text Interpretation:  Sinus rhythm Baseline wander Normal ECG No old tracing to compare Confirmed by Rolland Porter [phone]) on 01/03/2018 2:07:11 AM   Radiology Dg Chest Port 1 View  Result Date: 01/03/2018 CLINICAL DATA:  Sudden onset left-sided chest pain. EXAM: PORTABLE CHEST 1 VIEW COMPARISON:  08/17/2011 FINDINGS: The heart size and mediastinal contours are within normal limits. Both lungs are clear. The visualized skeletal structures are unremarkable. IMPRESSION: No active disease. Electronically Signed   By: Ashley Royalty M.D.   On: 01/03/2018 03:50    Procedures Procedures (including critical care time)   Echocardiogram January 02, 2018 Study Conclusions  - Left ventricle: The cavity size was normal. Wall thickness was   normal. Systolic function was normal. The estimated  ejection   fraction was in the range of 55% to 60%. Wall motion was normal;   there were no regional wall motion abnormalities. Doppler   parameters are consistent with abnormal left ventricular   relaxation (grade 1 diastolic dysfunction). - Aortic valve: Mildly calcified annulus. Trileaflet. There was   mild regurgitation. - Mitral valve: Mildly calcified annulus. There was trivial   regurgitation. - Right atrium: Central venous pressure (est): 3 mm Hg. - Tricuspid valve: There was mild regurgitation. - Pulmonary arteries: PA peak pressure: 29 mm Hg (S). - Pericardium, extracardiac: There was no pericardial effusion.  Medications Ordered in ED Medications - No data to display   Initial Impression / Assessment and Plan / ED Course  I have reviewed the triage vital signs and the nursing notes.  Pertinent labs & imaging results that were available during my care of the patient were reviewed by me and considered in my medical decision making (see chart for details).     Laboratory testing was ordered and portable chest x-ray.  I instructed the patient to let the nurse know if her pain returns.  D-dimer was done to screen for PE.  Troponin was done to screen for myocardial ischemia.  Chest x-ray was done to rule out other possibilities such as pneumonia or widened aorta such as dissection.  EKG was done without acute changes.  Check at 4:45 AM patient is sleeping.  She states she has had no further episodes of chest pain.  We discussed her test results including her echocardiogram.  We discussed getting a delta troponin which will be after 5 AM, that would be 4 hours after the  onset of her chest pain.  If that is negative she will be discharged home.  6 AM patient's delta troponin is negative.  She was discharged home with nitroglycerin since she states it helped tonight.  She has already been seen by cardiology and she was advised to call his office this morning and that them know she had  chest pain which is a new symptom and had to come to the ED tonight.  They should probably check her soon this week in the office.  She was advised if she got chest pain again to sit down and take the nitroglycerin and called 911 to transport her back to the ED.  Final Clinical Impressions(s) / ED Diagnoses   Final diagnoses:  Chest pain, unspecified type    ED Discharge Orders        Ordered    nitroGLYCERIN (NITROSTAT) 0.4 MG SL tablet  Every 5 min PRN     01/03/18 0558      Plan discharge  Rolland Porter, MD, Barbette Or, MD 01/03/18 [phone]

## 2018-01-03 NOTE — Discharge Instructions (Addendum)
Please call Dr Nelly Laurence office this morning to let him know about your chest pain and your ED visit. If you get the chest pain again, sit down and put 1 of the nitroglycerin pills under your tongue and call 911.

## 2018-01-04 ENCOUNTER — Encounter: Payer: Self-pay | Admitting: Cardiology

## 2018-01-05 ENCOUNTER — Telehealth: Payer: Self-pay

## 2018-01-05 NOTE — Telephone Encounter (Signed)
-----   Message from Arnoldo Lenis, MD sent at 01/05/2018  2:00 PM EDT ----- Echo overall looks good. She has one heart valve (her mitral valve) that is just mildly leaky that creates a murmur, this is nothing to be worried about and is very common. I read about her episode of chest pain and ER visit, testing there looked ok. I'd like her to continue taking the antacid we discussed and f/u in 2-3 weeks with PA, if ongoing symptoms would consider stress test at that time.   Zandra Abts MD

## 2018-01-05 NOTE — Telephone Encounter (Signed)
Called pt. No answer. Left message for pt to return call.  

## 2018-01-09 ENCOUNTER — Telehealth: Payer: Self-pay

## 2018-01-09 NOTE — Telephone Encounter (Signed)
-----   Message from Arnoldo Lenis, MD sent at 01/05/2018  2:00 PM EDT ----- Echo overall looks good. She has one heart valve (her mitral valve) that is just mildly leaky that creates a murmur, this is nothing to be worried about and is very common. I read about her episode of chest pain and ER visit, testing there looked ok. I'd like her to continue taking the antacid we discussed and f/u in 2-3 weeks with PA, if ongoing symptoms would consider stress test at that time.   Zandra Abts MD

## 2018-01-09 NOTE — Telephone Encounter (Signed)
Will mail letter informing pt of results. Unable to reach by phone. Pt not on MyChart.

## 2018-01-09 NOTE — Telephone Encounter (Signed)
Called pt., no answer. Left message for pt to return call.  

## 2018-01-24 NOTE — Progress Notes (Deleted)
Cardiology Office Note    Date:  01/24/2018   ID:  Shelley Bautista, DOB 07/01/1956, MRN [id number]  PCP:  [facility]  Cardiologist: Carlyle Dolly, MD    No chief complaint on file.   History of Present Illness:    Shelley Bautista is a 62 y.o. female with past medical history of hypertension and syncope who presents to the office today for 3-week follow-up.  She was examined by Dr. Harl Bowie is a new patient on 12/30/2017 after being found to have a new heart murmur.  She reported some dyspnea on exertion and had experienced episodes of chest discomfort which was usually worse with deep breathing and would resolve with Tums. An echocardiogram was obtained for initial evaluation which showed a preserved EF of 55 to 60%, no regional wall motion abnormalities, grade 1 diastolic dysfunction, mild AI, and trivial MR. It was recommended she follow-up within several weeks and consider a stress test if still having symptoms.   Past Medical History:  Diagnosis Date  . Headache   . Hypertension     Past Surgical History:  Procedure Laterality Date  . CYSTECTOMY     Ovary    Current Medications: Outpatient Medications Prior to Visit  Medication Sig Dispense Refill  . amLODipine (NORVASC) 2.5 MG tablet Take 2.5 mg by mouth daily.  11  . atorvastatin (LIPITOR) 20 MG tablet Take 20 mg by mouth daily.  1  . estrogens, conjugated, (PREMARIN) 0.625 MG tablet Take 0.625 mg by mouth daily. Take daily for 21 days then do not take for 7 days.    Marland Kitchen losartan-hydrochlorothiazide (HYZAAR) 100-25 MG per tablet Take 1 tablet by mouth 2 (two) times daily.    . nitroGLYCERIN (NITROSTAT) 0.4 MG SL tablet Place 1 tablet (0.4 mg total) under the tongue every 5 (five) minutes as needed for chest pain. 30 tablet 0   No facility-administered medications prior to visit.      Allergies:   Patient has no known allergies.   Social History   Socioeconomic History  . Marital status:  Married    Spouse name: Milbert Coulter  . Number of children: 4  . Years of education: GED  . Highest education level: Not on file  Occupational History    Employer: [employer]    Comment: Pension scheme manager  Social Needs  . Financial resource strain: Not on file  . Food insecurity:    Worry: Not on file    Inability: Not on file  . Transportation needs:    Medical: Not on file    Non-medical: Not on file  Tobacco Use  . Smoking status: Never Smoker  . Smokeless tobacco: Never Used  Substance and Sexual Activity  . Alcohol use: Yes    Comment: 3 drinks  . Drug use: No  . Sexual activity: Not on file  Lifestyle  . Physical activity:    Days per week: Not on file    Minutes per session: Not on file  . Stress: Not on file  Relationships  . Social connections:    Talks on phone: Not on file    Gets together: Not on file    Attends religious service: Not on file    Active member of club or organization: Not on file    Attends meetings of clubs or organizations: Not on file    Relationship status: Not on file  Other Topics Concern  . Not on file  Social History Narrative  Patient lives at home with her family.   Caffeine use: 2 drinks daily     Family History:  The patient's ***family history includes Cancer in her mother; Other in her father.   Review of Systems:   Please see the history of present illness.     General:  No chills, fever, night sweats or weight changes.  Cardiovascular:  No chest pain, dyspnea on exertion, edema, orthopnea, palpitations, paroxysmal nocturnal dyspnea. Dermatological: No rash, lesions/masses Respiratory: No cough, dyspnea Urologic: No hematuria, dysuria Abdominal:   No nausea, vomiting, diarrhea, bright red blood per rectum, melena, or hematemesis Neurologic:  No visual changes, wkns, changes in mental status. All other systems reviewed and are otherwise negative except as noted above.   Physical Exam:    VS:  There were no vitals taken  for this visit.   General: Well developed, well nourished,female appearing in no acute distress. Head: Normocephalic, atraumatic, sclera non-icteric, no xanthomas, nares are without discharge.  Neck: No carotid bruits. JVD not elevated.  Lungs: Respirations regular and unlabored, without wheezes or rales.  Heart: ***Regular rate and rhythm. No S3 or S4.  No murmur, no rubs, or gallops appreciated. Abdomen: Soft, non-tender, non-distended with normoactive bowel sounds. No hepatomegaly. No rebound/guarding. No obvious abdominal masses. Msk:  Strength and tone appear normal for age. No joint deformities or effusions. Extremities: No clubbing or cyanosis. No edema.  Distal pedal pulses are 2+ bilaterally. Neuro: Alert and oriented X 3. Moves all extremities spontaneously. No focal deficits noted. Psych:  Responds to questions appropriately with a normal affect. Skin: No rashes or lesions noted  Wt Readings from Last 3 Encounters:  01/03/18 148 lb (67.1 kg)  12/30/17 148 lb (67.1 kg)  07/09/14 146 lb (66.2 kg)        Studies/Labs Reviewed:   EKG:  EKG is*** ordered today.  The ekg ordered today demonstrates ***  Recent Labs: 01/03/2018: ALT 35; BUN 20; Creatinine, Ser 0.87; Hemoglobin 12.1; Platelets 266; Potassium 3.5; Sodium 139   Lipid Panel No results found for: CHOL, TRIG, HDL, CHOLHDL, VLDL, LDLCALC, LDLDIRECT  Additional studies/ records that were reviewed today include:   Echocardiogram: 01/05/2018 Study Conclusions  - Left ventricle: The cavity size was normal. Wall thickness was   normal. Systolic function was normal. The estimated ejection   fraction was in the range of 55% to 60%. Wall motion was normal;   there were no regional wall motion abnormalities. Doppler   parameters are consistent with abnormal left ventricular   relaxation (grade 1 diastolic dysfunction). - Aortic valve: Mildly calcified annulus. Trileaflet. There was   mild regurgitation. - Mitral valve:  Mildly calcified annulus. There was trivial   regurgitation. - Right atrium: Central venous pressure (est): 3 mm Hg. - Tricuspid valve: There was mild regurgitation. - Pulmonary arteries: PA peak pressure: 29 mm Hg (S). - Pericardium, extracardiac: There was no pericardial effusion.  Assessment:    No diagnosis found.   Plan:   In order of problems listed above:  1. ***    Medication Adjustments/Labs and Tests Ordered: Current medicines are reviewed at length with the patient today.  Concerns regarding medicines are outlined above.  Medication changes, Labs and Tests ordered today are listed in the Patient Instructions below. There are no Patient Instructions on file for this visit.   Signed, Erma Heritage, PA-C  01/24/2018 1:15 PM    [facility] [address] [place], [place] [address] Phone: [phone]

## 2018-01-25 ENCOUNTER — Ambulatory Visit: Payer: BLUE CROSS/BLUE SHIELD | Admitting: Student

## 2018-02-09 ENCOUNTER — Encounter: Payer: Self-pay | Admitting: Physician Assistant

## 2018-02-09 DIAGNOSIS — R011 Cardiac murmur, unspecified: Secondary | ICD-10-CM | POA: Insufficient documentation

## 2018-02-09 DIAGNOSIS — I10 Essential (primary) hypertension: Secondary | ICD-10-CM | POA: Insufficient documentation

## 2018-02-09 DIAGNOSIS — R05 Cough: Secondary | ICD-10-CM | POA: Insufficient documentation

## 2018-02-09 DIAGNOSIS — R058 Other specified cough: Secondary | ICD-10-CM | POA: Insufficient documentation

## 2018-02-09 NOTE — Progress Notes (Signed)
Cardiology Office Note    Date:  02/10/2018  ID:  Shelley Bautista, DOB 1955/10/07, MRN [id number] PCP:  [facility]  Cardiologist:  Dr. Harl Bowie  Chief Complaint: f/u chest pain  History of Present Illness:  Shelley Bautista is a 62 y.o. female with history of HTN, headaches, estrogen replacement therapy was recently seen as a new patient by Dr. Harl Bowie 12/30/17 with heart murmur, atypical chest pain and syncope. She had reported chest burning 2-3x a week, worse with deep breathing, lasting a few minutes, occurring at any time, with a cough at night. She also had had an episode of syncope while sitting - had significant URI, had a severe cough, stood up to go to the bathroom and blacked out. 2D Echo 01/02/18 showed EF 55-60%, grade 1 DD, mild AI, trivial MR/TR, PASP 68mmHg, CVP 3. Syncope was felt to be post-tussive with likely orthostatic nature in setting of URI. Chest pain was felt atypical and OTC Zantac was recommended. She was seen back in the ER 01/03/18 for sharp stabbing chest pain. D dimer and troponins were negative. CBC wnl. CMET showed K 3.5, Cr 0.87, albumin 3.3, glucose 108.  She returns for follow-up today feeling great. She has not had any recurrent chest pain or indigestion since starting Zantac. She went back to work the next day after her ER episode and has not had any further chest pain including with physical laborious exertion. No dyspnea. She checks her BP occasionally at home and it can sometimes spike into the 140s.   Past Medical History:  Diagnosis Date  . Atypical chest pain   . Headache   . Heart murmur    a. benign -  2D Echo 01/02/18 showed EF 55-60%, grade 1 DD, mild AI, trivial MR/TR, PASP 24mmHg, CVP 3.   . Hypertension   . Post-tussive syncope     Past Surgical History:  Procedure Laterality Date  . CYSTECTOMY     Ovary    Current Medications: Current Meds  Medication Sig  . amLODipine (NORVASC) 2.5 MG tablet Take 2.5 mg by mouth daily.    Marland Kitchen atorvastatin (LIPITOR) 20 MG tablet Take 20 mg by mouth daily.  Marland Kitchen estrogens, conjugated, (PREMARIN) 0.625 MG tablet Take 0.625 mg by mouth daily. Take daily for 21 days then do not take for 7 days.  Marland Kitchen losartan-hydrochlorothiazide (HYZAAR) 100-25 MG per tablet Take 1 tablet by mouth 2 (two) times daily.  . nitroGLYCERIN (NITROSTAT) 0.4 MG SL tablet Place 1 tablet (0.4 mg total) under the tongue every 5 (five) minutes as needed for chest pain.    Allergies:   Patient has no known allergies.   Social History   Socioeconomic History  . Marital status: Married    Spouse name: Milbert Coulter  . Number of children: 4  . Years of education: GED  . Highest education level: Not on file  Occupational History    Employer: [employer]    Comment: Pension scheme manager  Social Needs  . Financial resource strain: Not on file  . Food insecurity:    Worry: Not on file    Inability: Not on file  . Transportation needs:    Medical: Not on file    Non-medical: Not on file  Tobacco Use  . Smoking status: Never Smoker  . Smokeless tobacco: Never Used  Substance and Sexual Activity  . Alcohol use: Yes    Comment: 3 drinks  . Drug use: No  . Sexual activity: Not  on file  Lifestyle  . Physical activity:    Days per week: Not on file    Minutes per session: Not on file  . Stress: Not on file  Relationships  . Social connections:    Talks on phone: Not on file    Gets together: Not on file    Attends religious service: Not on file    Active member of club or organization: Not on file    Attends meetings of clubs or organizations: Not on file    Relationship status: Not on file  Other Topics Concern  . Not on file  Social History Narrative   Patient lives at home with her family.   Caffeine use: 2 drinks daily     Family History:  Family History  Problem Relation Age of Onset  . Cancer Mother   . Other Father        blood clot     ROS:   Please see the history of present illness.  All  other systems are reviewed and otherwise negative.    PHYSICAL EXAM:   VS:  BP 128/86   Pulse 80   Ht 4\' 11"  (1.499 m)   Wt 152 lb (68.9 kg)   SpO2 98%   BMI 30.70 kg/m   BMI: Body mass index is 30.7 kg/m. GEN: Well nourished, well developed overweight AAF, in no acute distress  HEENT: normocephalic, atraumatic Neck: no JVD, carotid bruits, or masses Cardiac: RRR. Minimal SEM. No rubs or gallops, no edema  Respiratory:  clear to auscultation bilaterally, normal work of breathing GI: soft, nontender, nondistended, + BS MS: no deformity or atrophy  Skin: warm and dry, no rash Neuro:  Alert and Oriented x 3, Strength and sensation are intact, follows commands Psych: euthymic mood, full affect  Wt Readings from Last 3 Encounters:  02/10/18 152 lb (68.9 kg)  01/03/18 148 lb (67.1 kg)  12/30/17 148 lb (67.1 kg)      Studies/Labs Reviewed:   EKG:  EKG was not ordered today. Reviewed EKGs from last OV and ER.  Recent Labs: 01/03/2018: ALT 35; BUN 20; Creatinine, Ser 0.87; Hemoglobin 12.1; Platelets 266; Potassium 3.5; Sodium 139   Lipid Panel No results found for: CHOL, TRIG, HDL, CHOLHDL, VLDL, LDLCALC, LDLDIRECT  Additional studies/ records that were reviewed today include: Summarized above.    ASSESSMENT & PLAN:   1. Atypical chest pain - resolved with addition of Zantac. Warning sx reviewed. Echo with normal LV function. She will notify us of any recurrent sx. 2. Essential HTN - BP recheck by me was 138/86. Will increase amlodipine to 5mg  daily. Discussed reduction of salt in diet, increased physical activity and weight management. I suspect she'll need 10mg  daily but given hx of post-tussive syncope would prefer to slowly uptitrate. Asked her to monitor BP regularly as she has been doing and call if running >361 systolic or >44 diastolic. We discussed that her blood pressure can be formally followed by primary care from here on out given no significant active cardiac  issues. 3. Diastolic dysfunction without CHF - discussed sodium limitation, BP control and weight management. 4. 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 Follow. 5. Heart murmur - benign with echo findings as above.  Disposition: F/u with Dr. Harl Bowie in 1 year.    Medication Adjustments/Labs and Tests Ordered: Current medicines are reviewed at length with the patient today.  Concerns regarding medicines are outlined above. Medication changes, Labs and Tests ordered today are summarized above  and listed in the Patient Instructions accessible in Encounters.   Signed, Charlie Pitter, PA-C  02/10/2018 3:43 PM    [facility] Location in [place], [place] [address] Ph: [phone]; Fax [phone]

## 2018-02-10 ENCOUNTER — Ambulatory Visit (INDEPENDENT_AMBULATORY_CARE_PROVIDER_SITE_OTHER): Payer: BLUE CROSS/BLUE SHIELD | Admitting: Physician Assistant

## 2018-02-10 ENCOUNTER — Encounter: Payer: Self-pay | Admitting: Physician Assistant

## 2018-02-10 VITALS — BP 138/86 | HR 80 | Ht 59.0 in | Wt 152.0 lb

## 2018-02-10 DIAGNOSIS — R011 Cardiac murmur, unspecified: Secondary | ICD-10-CM

## 2018-02-10 DIAGNOSIS — R0789 Other chest pain: Secondary | ICD-10-CM

## 2018-02-10 DIAGNOSIS — R058 Other specified cough: Secondary | ICD-10-CM

## 2018-02-10 DIAGNOSIS — R05 Cough: Secondary | ICD-10-CM | POA: Diagnosis not present

## 2018-02-10 DIAGNOSIS — I10 Essential (primary) hypertension: Secondary | ICD-10-CM | POA: Diagnosis not present

## 2018-02-10 MED ORDER — AMLODIPINE BESYLATE 5 MG PO TABS: 5.0000 mg | ORAL_TABLET | Freq: Every day | ORAL | 3 refills | Status: DC

## 2018-02-10 NOTE — Patient Instructions (Addendum)
Medication Instructions:  Your physician has recommended you make the following change in your medication: Increase Norvasc to 5 mg Daily    Labwork: NONE   Testing/Procedures: NONE   Follow-Up: Your physician wants you to follow-up in: 1 year.  You will receive a reminder letter in the mail two months in advance. If you don't receive a letter, please call our office to schedule the follow-up appointment.   Any Other Special Instructions Will Be Listed Below (If Applicable).     If you need a refill on your cardiac medications before your next appointment, please call your pharmacy.   Please monitor your blood pressure occasionally at home. Call your doctor if you tend to get readings of greater than 130 on the top number or 80 on the bottom number.

## 2018-03-06 ENCOUNTER — Emergency Department (HOSPITAL_COMMUNITY)
Admission: EM | Admit: 2018-03-06 | Discharge: 2018-03-06 | Disposition: A | Discharge status: Other Acute Inpatient Hospital | Payer: BLUE CROSS/BLUE SHIELD

## 2018-11-23 ENCOUNTER — Emergency Department (HOSPITAL_COMMUNITY)
Admission: EM | Admit: 2018-11-23 | Discharge: 2018-11-23 | Disposition: A | Discharge status: Home / Self Care | Payer: BLUE CROSS/BLUE SHIELD | Attending: Emergency Medicine | Admitting: Emergency Medicine

## 2018-11-23 ENCOUNTER — Encounter (HOSPITAL_COMMUNITY): Payer: Self-pay | Admitting: *Deleted

## 2018-11-23 ENCOUNTER — Emergency Department (HOSPITAL_COMMUNITY): Payer: BLUE CROSS/BLUE SHIELD

## 2018-11-23 DIAGNOSIS — I10 Essential (primary) hypertension: Secondary | ICD-10-CM | POA: Insufficient documentation

## 2018-11-23 DIAGNOSIS — Z79899 Other long term (current) drug therapy: Secondary | ICD-10-CM | POA: Insufficient documentation

## 2018-11-23 DIAGNOSIS — R0789 Other chest pain: Secondary | ICD-10-CM

## 2018-11-23 HISTORY — DX: Pure hypercholesterolemia, unspecified: E78.00

## 2018-11-23 LAB — BASIC METABOLIC PANEL
ANION GAP: 8 (ref 5–15)
BUN: 25 mg/dL — ABNORMAL HIGH (ref 8–23)
CHLORIDE: 103 mmol/L (ref 98–111)
CO2: 26 mmol/L (ref 22–32)
Calcium: 9 mg/dL (ref 8.9–10.3)
Creatinine, Ser: 0.83 mg/dL (ref 0.44–1.00)
GFR calc non Af Amer: >60 mL/min (ref >60)
Glucose, Bld: 112 mg/dL — ABNORMAL HIGH (ref 70–99)
Potassium: 3.3 mmol/L — ABNORMAL LOW (ref 3.5–5.1)
Sodium: 137 mmol/L (ref 135–145)

## 2018-11-23 LAB — CBC
HEMATOCRIT: 41 % (ref 36.0–46.0)
Hemoglobin: 12.6 g/dL (ref 12.0–15.0)
MCH: 27.2 pg (ref 26.0–34.0)
MCHC: 30.7 g/dL (ref 30.0–36.0)
MCV: 88.6 fL (ref 80.0–100.0)
NRBC: 0 % (ref 0.0–0.2)
Platelets: 299 10*3/uL (ref 150–400)
RBC: 4.63 MIL/uL (ref 3.87–5.11)
RDW: 13.5 % (ref 11.5–15.5)
WBC: 8.8 10*3/uL (ref 4.0–10.5)

## 2018-11-23 LAB — TROPONIN I
Troponin I: <0.03 ng/mL (ref <0.03)
Troponin I: <0.03 ng/mL (ref <0.03)

## 2018-11-23 MED ORDER — LIDOCAINE VISCOUS HCL 2 % MT SOLN
15.0000 mL | Freq: Once | OROMUCOSAL | Status: AC
  Administered 2018-11-23: 15 mL via ORAL
  Filled 2018-11-23: qty 15

## 2018-11-23 MED ORDER — PANTOPRAZOLE SODIUM 20 MG PO TBEC: 20.0000 mg | DELAYED_RELEASE_TABLET | Freq: Every day | ORAL | 0 refills | Status: DC

## 2018-11-23 MED ORDER — FAMOTIDINE IN NACL 20-0.9 MG/50ML-% IV SOLN
20.0000 mg | Freq: Once | INTRAVENOUS | Status: AC
  Administered 2018-11-23: 20 mg via INTRAVENOUS
  Filled 2018-11-23: qty 50

## 2018-11-23 MED ORDER — ALUM & MAG HYDROXIDE-SIMETH 200-200-20 MG/5ML PO SUSP
30.0000 mL | Freq: Once | ORAL | Status: AC
  Administered 2018-11-23: 30 mL via ORAL
  Filled 2018-11-23: qty 30

## 2018-11-23 MED ORDER — SODIUM CHLORIDE 0.9% FLUSH: 3.0000 mL | Freq: Once | INTRAVENOUS | Status: DC

## 2018-11-23 NOTE — ED Provider Notes (Signed)
[facility] EMERGENCY DEPARTMENT Provider Note   CSN: [id number] Arrival date & time: 11/23/18  1820    History   Chief Complaint Chief Complaint  Patient presents with  . Chest Pain    HPI Shelley Bautista is a 63 y.o. female.     Patient complains of pain to her distal sternum.  And also some pressure.  The history is provided by the patient. No language interpreter was used.  Chest Pain  Pain location:  Substernal area Pain quality: aching   Pain radiates to:  Does not radiate Pain severity:  Moderate Onset quality:  Sudden Timing:  Constant Progression:  Waxing and waning Associated symptoms: no abdominal pain, no back pain, no cough, no fatigue and no headache     Past Medical History:  Diagnosis Date  . Atypical chest pain   . Headache   . Heart murmur    a. benign -  2D Echo 01/02/18 showed EF 55-60%, grade 1 DD, mild AI, trivial MR/TR, PASP 17mmHg, CVP 3.   . High cholesterol   . Hypertension   . Post-tussive syncope     Patient Active Problem List   Diagnosis Date Noted  . Heart murmur 02/09/2018  . Essential hypertension 02/09/2018  . Post-tussive syncope 02/09/2018  . 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 Migraine 07/12/2014    Past Surgical History:  Procedure Laterality Date  . CYSTECTOMY     Ovary     OB History   No obstetric history on file.      Home Medications    Prior to Admission medications   Medication Sig Start Date End Date Taking? Authorizing Provider  amLODipine (NORVASC) 5 MG tablet Take 1 tablet (5 mg total) by mouth daily. 02/10/18 05/11/18  Dunn, Nedra Hai, PA-C  atorvastatin (LIPITOR) 20 MG tablet Take 20 mg by mouth daily. 11/25/17   [provider]  estrogens, conjugated, (PREMARIN) 0.625 MG tablet Take 0.625 mg by mouth daily. Take daily for 21 days then do not take for 7 days.    [provider]  losartan-hydrochlorothiazide (HYZAAR) 100-25 MG per tablet Take 1 tablet by mouth 2 (two) times daily.     [provider]  nitroGLYCERIN (NITROSTAT) 0.4 MG SL tablet Place 1 tablet (0.4 mg total) under the tongue every 5 (five) minutes as needed for chest pain. 01/03/18   Rolland Porter, MD  pantoprazole (PROTONIX) 20 MG tablet Take 1 tablet (20 mg total) by mouth daily. 11/23/18   Milton Ferguson, MD    Family History Family History  Problem Relation Age of Onset  . Cancer Mother   . Other Father        blood clot    Social History Social History   Tobacco Use  . Smoking status: Never Smoker  . Smokeless tobacco: Never Used  Substance Use Topics  . Alcohol use: Yes    Comment: 3 drinks  . Drug use: No     Allergies   Patient has no known allergies.   Review of Systems Review of Systems  Constitutional: Negative for appetite change and fatigue.  HENT: Negative for congestion, ear discharge and sinus pressure.   Eyes: Negative for discharge.  Respiratory: Negative for cough.   Cardiovascular: Positive for chest pain.  Gastrointestinal: Negative for abdominal pain and diarrhea.  Genitourinary: Negative for frequency and hematuria.  Musculoskeletal: Negative for back pain.  Skin: Negative for rash.  Neurological: Negative for seizures and headaches.  Psychiatric/Behavioral: Negative for hallucinations.  Physical Exam Updated Vital Signs BP 117/77   Pulse 83   Temp 98.9 F (37.2 C) (Oral)   Resp 13   Ht 4\' 11"  (1.499 m)   Wt 68.9 kg   SpO2 95%   BMI 30.70 kg/m   Physical Exam Vitals signs reviewed.  Constitutional:      Appearance: Normal appearance. She is well-developed.  HENT:     Head: Normocephalic.     Nose: Nose normal.  Eyes:     General: No scleral icterus.    Conjunctiva/sclera: Conjunctivae normal.  Neck:     Musculoskeletal: Neck supple.     Thyroid: No thyromegaly.     Trachea: No tracheal deviation.  Cardiovascular:     Rate and Rhythm: Normal rate and regular rhythm.     Heart sounds: No murmur. No friction rub. No gallop.     Pulmonary:     Breath sounds: No stridor. No wheezing or rales.  Chest:     Chest wall: No tenderness.  Abdominal:     General: There is no distension.     Tenderness: There is abdominal tenderness. There is no rebound.  Musculoskeletal: Normal range of motion.  Lymphadenopathy:     Cervical: No cervical adenopathy.  Skin:    General: Skin is warm.     Findings: No erythema or rash.  Neurological:     Mental Status: She is alert and oriented to person, place, and time.     Motor: No abnormal muscle tone.     Coordination: Coordination normal.  Psychiatric:        Behavior: Behavior normal.      ED Treatments / Results  Labs (all labs ordered are listed, but only abnormal results are displayed) Labs Reviewed  BASIC METABOLIC PANEL - Abnormal; Notable for the following components:      Result Value   Potassium 3.3 (*)    Glucose, Bld 112 (*)    BUN 25 (*)    All other components within normal limits  CBC  TROPONIN I  TROPONIN I    EKG None  Radiology Dg Chest 2 View  Result Date: 11/23/2018 CLINICAL DATA:  Chest pressure EXAM: CHEST - 2 VIEW COMPARISON:  01/03/2018 FINDINGS: Heart and mediastinal contours are within normal limits. No focal opacities or effusions. No acute bony abnormality. IMPRESSION: No active cardiopulmonary disease. Electronically Signed   By: Rolm Baptise M.D.   On: 11/23/2018 19:18    Procedures Procedures (including critical care time)  Medications Ordered in ED Medications  sodium chloride flush (NS) 0.9 % injection 3 mL (3 mLs Intravenous Not Given 11/23/18 1921)  alum & mag hydroxide-simeth (MAALOX/MYLANTA) 200-200-20 MG/5ML suspension 30 mL (30 mLs Oral Given 11/23/18 1917)    And  lidocaine (XYLOCAINE) 2 % viscous mouth solution 15 mL (15 mLs Oral Given 11/23/18 1917)  famotidine (PEPCID) IVPB 20 mg premix (0 mg Intravenous Stopped 11/23/18 1959)     Initial Impression / Assessment and Plan / ED Course  I have reviewed the triage  vital signs and the nursing notes.  Pertinent labs & imaging results that were available during my care of the patient were reviewed by me and considered in my medical decision making (see chart for details).        Patient improved with antiacid's.  I suspect her discomfort is more related to GERD and she will be placed on Protonix and will follow-up with her PCP  Final Clinical Impressions(s) / ED Diagnoses  Final diagnoses:  Atypical chest pain    ED Discharge Orders         Ordered    pantoprazole (PROTONIX) 20 MG tablet  Daily     11/23/18 2150           Milton Ferguson, MD 11/23/18 2152

## 2018-11-23 NOTE — ED Triage Notes (Signed)
Pt with cp that started last night while at work at 2100.  Light headedness last night.  Denies sob or N/V

## 2018-11-23 NOTE — Discharge Instructions (Addendum)
Follow-up with your family doctor next week for recheck. 

## 2018-12-19 DIAGNOSIS — I10 Essential (primary) hypertension: Secondary | ICD-10-CM | POA: Diagnosis not present

## 2018-12-19 DIAGNOSIS — E7849 Other hyperlipidemia: Secondary | ICD-10-CM | POA: Diagnosis not present

## 2018-12-19 DIAGNOSIS — E6609 Other obesity due to excess calories: Secondary | ICD-10-CM | POA: Diagnosis not present

## 2018-12-19 DIAGNOSIS — G4709 Other insomnia: Secondary | ICD-10-CM | POA: Diagnosis not present

## 2018-12-19 DIAGNOSIS — Z1389 Encounter for screening for other disorder: Secondary | ICD-10-CM | POA: Diagnosis not present

## 2018-12-19 DIAGNOSIS — E119 Type 2 diabetes mellitus without complications: Secondary | ICD-10-CM | POA: Diagnosis not present

## 2019-01-16 ENCOUNTER — Telehealth (INDEPENDENT_AMBULATORY_CARE_PROVIDER_SITE_OTHER): Payer: BLUE CROSS/BLUE SHIELD | Admitting: Cardiology

## 2019-01-16 ENCOUNTER — Encounter: Payer: Self-pay | Admitting: Cardiology

## 2019-01-16 VITALS — BP 144/86 | Ht 59.0 in | Wt 152.0 lb

## 2019-01-16 DIAGNOSIS — R0789 Other chest pain: Secondary | ICD-10-CM

## 2019-01-16 DIAGNOSIS — I10 Essential (primary) hypertension: Secondary | ICD-10-CM

## 2019-01-16 DIAGNOSIS — E782 Mixed hyperlipidemia: Secondary | ICD-10-CM

## 2019-01-16 NOTE — Progress Notes (Signed)
Medication Instructions:  Your physician recommends that you continue on your current medications as directed. Please refer to the Current Medication list given to you today.   Labwork: I will request kabs form pcp   Testing/Procedures: none  Follow-Up: Your physician wants you to follow-up in: 1 year.You will receive a reminder letter in the mail two months in advance. If you don't receive a letter, please call our office to schedule the follow-up appointment.  Any Other Special Instructions Will Be Listed Below (If Applicable).     If you need a refill on your cardiac medications before your next appointment, please call your pharmacy.

## 2019-01-16 NOTE — Progress Notes (Signed)
Virtual Visit via Telephone Note   This visit type was conducted due to national recommendations for restrictions regarding the COVID-19 Pandemic (e.g. social distancing) in an effort to limit this patient's exposure and mitigate transmission in our community.  Due to her co-morbid illnesses, this patient is at least at moderate risk for complications without adequate follow up.  This format is felt to be most appropriate for this patient at this time.  The patient did not have access to video technology/had technical difficulties with video requiring transitioning to audio format only (telephone).  All issues noted in this document were discussed and addressed.  No physical exam could be performed with this format.  Please refer to the patient's chart for her  consent to telehealth for [facility].   Evaluation Performed:  Follow-up visit  Date:  01/16/2019   ID:  Shelley Bautista, DOB September 25, 1956, MRN [id number]  Patient Location: Home Provider Location: Home  PCP:  [facility]  Cardiologist:  Carlyle Dolly, MD  Electrophysiologist:  None   Chief Complaint:  Chest pain  History of Present Illness:    Shelley Bautista is a 63 y.o. female with seen today for follow up of the following medical problems.    1. Chest pain - history of atypical symptoms last year resolved with zantac  - ER visit 10/2018 with chest pain - ER notes report abdominal tenderness to palpation. Given GI cocktail and IV pepcid.Trop neg x 2, EKG SR no ischemic changes. CXR no acute process.  - symptoms improved with antacids in ER, thought to be GERD. Started on protonix.   - since ER visit symptoms resolved.   2. HTN - compliant with meds - checks bp at work sometimes, does not sit 5 minutes prior to checking.  SH: works as Geologist, engineering at [employer]. Has had increased hourse recently, working 7 days a week   The patient does not have symptoms concerning for COVID-19 infection  (fever, chills, cough, or new shortness of breath).    Past Medical History:  Diagnosis Date  . Atypical chest pain   . Headache   . Heart murmur    a. benign -  2D Echo 01/02/18 showed EF 55-60%, grade 1 DD, mild AI, trivial MR/TR, PASP 91mmHg, CVP 3.   . High cholesterol   . Hypertension   . Post-tussive syncope    Past Surgical History:  Procedure Laterality Date  . CYSTECTOMY     Ovary     No outpatient medications have been marked as taking for the 01/16/19 encounter (Appointment) with Arnoldo Lenis, MD.     Allergies:   Patient has no known allergies.   Social History   Tobacco Use  . Smoking status: Never Smoker  . Smokeless tobacco: Never Used  Substance Use Topics  . Alcohol use: Yes    Comment: 3 drinks  . Drug use: No     Family Hx: The patient's family history includes Cancer in her mother; Other in her father.  ROS:   Please see the history of present illness.     All other systems reviewed and are negative.   Prior CV studies:   The following studies were reviewed today:  12/2017 echo Study Conclusions  - Left ventricle: The cavity size was normal. Wall thickness was   normal. Systolic function was normal. The estimated ejection   fraction was in the range of 55% to 60%. Wall motion was normal;   there were  no regional wall motion abnormalities. Doppler   parameters are consistent with abnormal left ventricular   relaxation (grade 1 diastolic dysfunction). - Aortic valve: Mildly calcified annulus. Trileaflet. There was   mild regurgitation. - Mitral valve: Mildly calcified annulus. There was trivial   regurgitation. - Right atrium: Central venous pressure (est): 3 mm Hg. - Tricuspid valve: There was mild regurgitation. - Pulmonary arteries: PA peak pressure: 29 mm Hg (S). - Pericardium, extracardiac: There was no pericardial effusion.  Labs/Other Tests and Data Reviewed:    EKG:  No ECG reviewed.  Recent Labs: 11/23/2018: BUN 25;  Creatinine, Ser 0.83; Hemoglobin 12.6; Platelets 299; Potassium 3.3; Sodium 137   Recent Lipid Panel No results found for: CHOL, TRIG, HDL, CHOLHDL, LDLCALC, LDLDIRECT  Wt Readings from Last 3 Encounters:  11/23/18 152 lb (68.9 kg)  02/10/18 152 lb (68.9 kg)  01/03/18 148 lb (67.1 kg)     Objective:    Vital Signs:  bp 144/86 Wt 152 lbs  Normal affect. Normal speech pattern and tone. Comfortable, in no distress. No auditory signs of SOB or wheezing.  ASSESSMENT & PLAN:    1. Chest pain - history of atypical symptoms that improve with antacids. Recent ER visit with pain, has resolved since changing to protonix - continue to monitor, no additional cardiac testing is planned  2. HTN - reported work numbers elevated, however does not sit for 5 minuts prior to checking, her work is very physically active - bp from recent ER visit was at goal, continue current meds   3. Hyperlipidemia - conitnue statin, f/u pcp labs     COVID-19 Education: The signs and symptoms of COVID-19 were discussed with the patient and how to seek care for testing (follow up with PCP or arrange E-visit).  The importance of social distancing was discussed today.  Time:   Today, I have spent 21  minutes with the patient with telehealth technology discussing the above problems.     Medication Adjustments/Labs and Tests Ordered: Current medicines are reviewed at length with the patient today.  Concerns regarding medicines are outlined above.   Tests Ordered: No orders of the defined types were placed in this encounter.   Medication Changes: No orders of the defined types were placed in this encounter.   Disposition:  Follow up 1 year  Signed, Carlyle Dolly, MD  01/16/2019 8:00 AM    [place]

## 2019-04-03 DIAGNOSIS — R51 Headache: Secondary | ICD-10-CM | POA: Diagnosis not present

## 2019-04-03 DIAGNOSIS — Y92410 Unspecified street and highway as the place of occurrence of the external cause: Secondary | ICD-10-CM | POA: Diagnosis not present

## 2019-04-03 DIAGNOSIS — M25551 Pain in right hip: Secondary | ICD-10-CM | POA: Diagnosis not present

## 2019-04-03 DIAGNOSIS — R52 Pain, unspecified: Secondary | ICD-10-CM | POA: Diagnosis not present

## 2019-04-03 DIAGNOSIS — Z041 Encounter for examination and observation following transport accident: Secondary | ICD-10-CM | POA: Diagnosis not present

## 2019-04-03 DIAGNOSIS — M542 Cervicalgia: Secondary | ICD-10-CM | POA: Diagnosis not present

## 2019-04-03 DIAGNOSIS — Y999 Unspecified external cause status: Secondary | ICD-10-CM | POA: Diagnosis not present

## 2019-04-03 DIAGNOSIS — G44309 Post-traumatic headache, unspecified, not intractable: Secondary | ICD-10-CM | POA: Diagnosis not present

## 2019-04-05 DIAGNOSIS — M5030 Other cervical disc degeneration, unspecified cervical region: Secondary | ICD-10-CM | POA: Diagnosis not present

## 2019-04-05 DIAGNOSIS — S134XXA Sprain of ligaments of cervical spine, initial encounter: Secondary | ICD-10-CM | POA: Diagnosis not present

## 2019-04-05 DIAGNOSIS — Z1389 Encounter for screening for other disorder: Secondary | ICD-10-CM | POA: Diagnosis not present

## 2019-04-05 DIAGNOSIS — Z681 Body mass index (BMI) 19 or less, adult: Secondary | ICD-10-CM | POA: Diagnosis not present

## 2020-01-14 ENCOUNTER — Telehealth: Payer: Self-pay

## 2020-01-14 NOTE — Telephone Encounter (Signed)
  Patient Consent for Virtual Visit         Shelley Bautista has provided verbal consent on 01/14/2020 for a virtual visit (video or telephone).   CONSENT FOR VIRTUAL VISIT FOR:  Shelley Bautista  By participating in this virtual visit I agree to the following:  I hereby voluntarily request, consent and authorize [facility] and its employed or contracted physicians, physician assistants, nurse practitioners or other licensed health care professionals (the Practitioner), to provide me with telemedicine health care services (the "Services") as deemed necessary by the treating Practitioner. I acknowledge and consent to receive the Services by the Practitioner via telemedicine. I understand that the telemedicine visit will involve communicating with the Practitioner through live audiovisual communication technology and the disclosure of certain medical information by electronic transmission. I acknowledge that I have been given the opportunity to request an in-person assessment or other available alternative prior to the telemedicine visit and am voluntarily participating in the telemedicine visit.  I understand that I have the right to withhold or withdraw my consent to the use of telemedicine in the course of my care at any time, without affecting my right to future care or treatment, and that the Practitioner or I may terminate the telemedicine visit at any time. I understand that I have the right to inspect all information obtained and/or recorded in the course of the telemedicine visit and may receive copies of available information for a reasonable fee.  I understand that some of the potential risks of receiving the Services via telemedicine include:  Marland Kitchen Delay or interruption in medical evaluation due to technological equipment failure or disruption; . Information transmitted may not be sufficient (e.g. poor resolution of images) to allow for appropriate medical decision making by the Practitioner;  and/or  . In rare instances, security protocols could fail, causing a breach of personal health information.  Furthermore, I acknowledge that it is my responsibility to provide information about my medical history, conditions and care that is complete and accurate to the best of my ability. I acknowledge that Practitioner's advice, recommendations, and/or decision may be based on factors not within their control, such as incomplete or inaccurate data provided by me or distortions of diagnostic images or specimens that may result from electronic transmissions. I understand that the practice of medicine is not an exact science and that Practitioner makes no warranties or guarantees regarding treatment outcomes. I acknowledge that a copy of this consent can be made available to me via my patient portal ([facility]), or I can request a printed copy by calling the office of [facility].    I understand that my insurance will be billed for this visit.   I have read or had this consent read to me. . I understand the contents of this consent, which adequately explains the benefits and risks of the Services being provided via telemedicine.  . I have been provided ample opportunity to ask questions regarding this consent and the Services and have had my questions answered to my satisfaction. . I give my informed consent for the services to be provided through the use of telemedicine in my medical care

## 2020-01-16 ENCOUNTER — Telehealth (INDEPENDENT_AMBULATORY_CARE_PROVIDER_SITE_OTHER): Payer: BLUE CROSS/BLUE SHIELD | Admitting: Cardiology

## 2020-01-16 ENCOUNTER — Encounter: Payer: Self-pay | Admitting: Cardiology

## 2020-01-16 VITALS — BP 137/88 | Ht 59.0 in | Wt 141.0 lb

## 2020-01-16 DIAGNOSIS — E782 Mixed hyperlipidemia: Secondary | ICD-10-CM

## 2020-01-16 DIAGNOSIS — R0789 Other chest pain: Secondary | ICD-10-CM

## 2020-01-16 DIAGNOSIS — I10 Essential (primary) hypertension: Secondary | ICD-10-CM

## 2020-01-16 NOTE — Progress Notes (Signed)
Virtual Visit via Telephone Note   This visit type was conducted due to national recommendations for restrictions regarding the COVID-19 Pandemic (e.g. social distancing) in an effort to limit this patient's exposure and mitigate transmission in our community.  Due to her co-morbid illnesses, this patient is at least at moderate risk for complications without adequate follow up.  This format is felt to be most appropriate for this patient at this time.  The patient did not have access to video technology/had technical difficulties with video requiring transitioning to audio format only (telephone).  All issues noted in this document were discussed and addressed.  No physical exam could be performed with this format.  Please refer to the patient's chart for her  consent to telehealth for [facility].   The patient was identified using 2 identifiers.  Date:  01/16/2020   ID:  Shelley Bautista, DOB 05/31/1956, MRN [id number]  Patient Location: Home Provider Location: Office  PCP:  [facility]  Cardiologist:  Carlyle Dolly, MD  Electrophysiologist:  None   Evaluation Performed:  Follow-Up Visit  Chief Complaint:  Follow up visit  History of Present Illness:    Shelley Bautista is a 64 y.o. female seen today for follow up of the following medical problems.    1. Chest pain - history of atypical symptoms last year resolved with zantac  - ER visit 10/2018 with chest pain - ER notes report abdominal tenderness to palpation. Given GI cocktail and IV pepcid.Trop neg x 2, EKG SR no ischemic changes. CXR no acute process.  - symptoms improved with antacids in ER, thought to be GERD. Started on protonix.   - no recent chest pains  2. HTN - compliant with meds - had not taken meds prior to bp check today    Thinking about covid shot, has not had yet  SH: works as Geologist, engineering at [employer]. Has had increased hours recently, working 7 days a week  The  patient does not have symptoms concerning for COVID-19 infection (fever, chills, cough, or new shortness of breath).    Past Medical History:  Diagnosis Date  . Atypical chest pain   . Headache   . Heart murmur    a. benign -  2D Echo 01/02/18 showed EF 55-60%, grade 1 DD, mild AI, trivial MR/TR, PASP 55mmHg, CVP 3.   . High cholesterol   . Hypertension   . Post-tussive syncope    Past Surgical History:  Procedure Laterality Date  . CYSTECTOMY     Ovary     No outpatient medications have been marked as taking for the 01/16/20 encounter (Appointment) with Arnoldo Lenis, MD.     Allergies:   Patient has no known allergies.   Social History   Tobacco Use  . Smoking status: Never Smoker  . Smokeless tobacco: Never Used  Substance Use Topics  . Alcohol use: Yes    Comment: 3 drinks  . Drug use: No     Family Hx: The patient's family history includes Cancer in her mother; Other in her father.  ROS:   Please see the history of present illness.     All other systems reviewed and are negative.   Prior CV studies:   The following studies were reviewed today:  12/2017 echo Study Conclusions  - Left ventricle: The cavity size was normal. Wall thickness was normal. Systolic function was normal. The estimated ejection fraction was in the range of 55% to 60%.  Wall motion was normal; there were no regional wall motion abnormalities. Doppler parameters are consistent with abnormal left ventricular relaxation (grade 1 diastolic dysfunction). - Aortic valve: Mildly calcified annulus. Trileaflet. There was mild regurgitation. - Mitral valve: Mildly calcified annulus. There was trivial regurgitation. - Right atrium: Central venous pressure (est): 3 mm Hg. - Tricuspid valve: There was mild regurgitation. - Pulmonary arteries: PA peak pressure: 29 mm Hg (S). - Pericardium, extracardiac: There was no pericardial effusion.  Labs/Other Tests and Data Reviewed:      EKG:  No ECG reviewed.  Recent Labs: No results found for requested labs within last 8760 hours.   Recent Lipid Panel No results found for: CHOL, TRIG, HDL, CHOLHDL, LDLCALC, LDLDIRECT  Wt Readings from Last 3 Encounters:  01/16/19 152 lb (68.9 kg)  11/23/18 152 lb (68.9 kg)  02/10/18 152 lb (68.9 kg)     Objective:    Vital Signs:   Today's Vitals   01/16/20 0847  BP: 137/88  Weight: 141 lb (64 kg)  Height: 4\' 11"  (1.499 m)   Body mass index is 28.48 kg/m. Normal affect. Normal speech pattern and tone. Comfortable, no apparent distress. No audible signs of sob or wheezing.   ASSESSMENT & PLAN:    1. Chest pain - history of atypical symptoms that improve with antacids.No recurrent symptoms -= no further workup at this time, continue to monitor.   2. HTN - mildly elevated but has not had meds yet, continue to monitor.    3. Hyperlipidemia - continue statin, labs followed by pcp  COVID-19 Education: The signs and symptoms of COVID-19 were discussed with the patient and how to seek care for testing (follow up with PCP or arrange E-visit).  The importance of social distancing was discussed today.  Time:   Today, I have spent 18 minutes with the patient with telehealth technology discussing the above problems.     Medication Adjustments/Labs and Tests Ordered: Current medicines are reviewed at length with the patient today.  Concerns regarding medicines are outlined above.   Tests Ordered: No orders of the defined types were placed in this encounter.   Medication Changes: No orders of the defined types were placed in this encounter.   Follow Up:  As needed  Signed, Carlyle Dolly, MD  01/16/2020 7:41 AM     [facility]

## 2020-01-16 NOTE — Patient Instructions (Signed)
Medication Instructions:  Your physician recommends that you continue on your current medications as directed. Please refer to the Current Medication list given to you today.   Labwork: NONE  Testing/Procedures: NONE  Follow-Up: Your physician recommends that you schedule a follow-up appointment in: AS NEEDED      Any Other Special Instructions Will Be Listed Below (If Applicable).     If you need a refill on your cardiac medications before your next appointment, please call your pharmacy.   

## 2020-01-17 DIAGNOSIS — Z23 Encounter for immunization: Secondary | ICD-10-CM | POA: Diagnosis not present

## 2020-02-06 ENCOUNTER — Other Ambulatory Visit: Payer: Self-pay

## 2020-02-06 NOTE — Telephone Encounter (Signed)
Pt states Dr.Branch stated he would call in stomach medication for her.  Please call [facility] [phone]   Thanks renee

## 2020-02-07 NOTE — Telephone Encounter (Signed)
Called pt no answer, left msg to call back  

## 2020-02-14 NOTE — Telephone Encounter (Signed)
Spoke with [facility] and let them know that pt is prn pt and we would not follow stomach medications for pt to contact pcp regarding this

## 2020-02-15 DIAGNOSIS — Z23 Encounter for immunization: Secondary | ICD-10-CM | POA: Diagnosis not present

## 2020-05-05 DIAGNOSIS — E663 Overweight: Secondary | ICD-10-CM | POA: Diagnosis not present

## 2020-05-05 DIAGNOSIS — Z1389 Encounter for screening for other disorder: Secondary | ICD-10-CM | POA: Diagnosis not present

## 2020-05-05 DIAGNOSIS — R1013 Epigastric pain: Secondary | ICD-10-CM | POA: Diagnosis not present

## 2020-05-05 DIAGNOSIS — G4709 Other insomnia: Secondary | ICD-10-CM | POA: Diagnosis not present

## 2020-05-05 DIAGNOSIS — Z6829 Body mass index (BMI) 29.0-29.9, adult: Secondary | ICD-10-CM | POA: Diagnosis not present

## 2020-05-07 DIAGNOSIS — Z6829 Body mass index (BMI) 29.0-29.9, adult: Secondary | ICD-10-CM | POA: Diagnosis not present

## 2020-05-07 DIAGNOSIS — E663 Overweight: Secondary | ICD-10-CM | POA: Diagnosis not present

## 2020-05-07 DIAGNOSIS — Z1389 Encounter for screening for other disorder: Secondary | ICD-10-CM | POA: Diagnosis not present

## 2020-09-18 DIAGNOSIS — K921 Melena: Secondary | ICD-10-CM | POA: Diagnosis not present

## 2020-09-18 DIAGNOSIS — K625 Hemorrhage of anus and rectum: Secondary | ICD-10-CM | POA: Diagnosis not present

## 2020-09-24 ENCOUNTER — Encounter (INDEPENDENT_AMBULATORY_CARE_PROVIDER_SITE_OTHER): Payer: Self-pay | Admitting: *Deleted

## 2020-11-27 ENCOUNTER — Other Ambulatory Visit (HOSPITAL_COMMUNITY): Payer: Self-pay | Admitting: Physician Assistant

## 2020-11-27 DIAGNOSIS — S76011A Strain of muscle, fascia and tendon of right hip, initial encounter: Secondary | ICD-10-CM | POA: Diagnosis not present

## 2020-11-27 DIAGNOSIS — Z1231 Encounter for screening mammogram for malignant neoplasm of breast: Secondary | ICD-10-CM

## 2020-11-27 DIAGNOSIS — R252 Cramp and spasm: Secondary | ICD-10-CM | POA: Diagnosis not present

## 2020-11-27 DIAGNOSIS — Z6829 Body mass index (BMI) 29.0-29.9, adult: Secondary | ICD-10-CM | POA: Diagnosis not present

## 2020-11-27 DIAGNOSIS — S76111A Strain of right quadriceps muscle, fascia and tendon, initial encounter: Secondary | ICD-10-CM | POA: Diagnosis not present

## 2020-12-01 ENCOUNTER — Encounter (INDEPENDENT_AMBULATORY_CARE_PROVIDER_SITE_OTHER): Payer: Self-pay | Admitting: *Deleted

## 2020-12-04 ENCOUNTER — Other Ambulatory Visit: Payer: Self-pay

## 2020-12-04 ENCOUNTER — Ambulatory Visit (HOSPITAL_COMMUNITY)
Admission: RE | Admit: 2020-12-04 | Discharge: 2020-12-04 | Disposition: A | Discharge status: Home / Self Care | Payer: BC Managed Care – PPO | Source: Ambulatory Visit | Attending: Physician Assistant | Admitting: Physician Assistant

## 2020-12-04 DIAGNOSIS — Z1231 Encounter for screening mammogram for malignant neoplasm of breast: Secondary | ICD-10-CM

## 2021-01-01 ENCOUNTER — Ambulatory Visit (INDEPENDENT_AMBULATORY_CARE_PROVIDER_SITE_OTHER): Payer: Self-pay | Admitting: Gastroenterology

## 2021-04-05 IMAGING — MG MM DIGITAL SCREENING BILAT W/ TOMO AND CAD
8 series · 8 of 24 positions shown · non-contrast
Comparison: Previous exam(s).

CLINICAL DATA: Screening.

EXAM:
DIGITAL SCREENING BILATERAL MAMMOGRAM WITH TOMOSYNTHESIS AND CAD
TECHNIQUE: Bilateral screening digital craniocaudal and mediolateral oblique
mammograms were obtained. Bilateral screening digital breast
tomosynthesis was performed. The images were evaluated with
computer-aided detection.

[L MLO synth-2D]
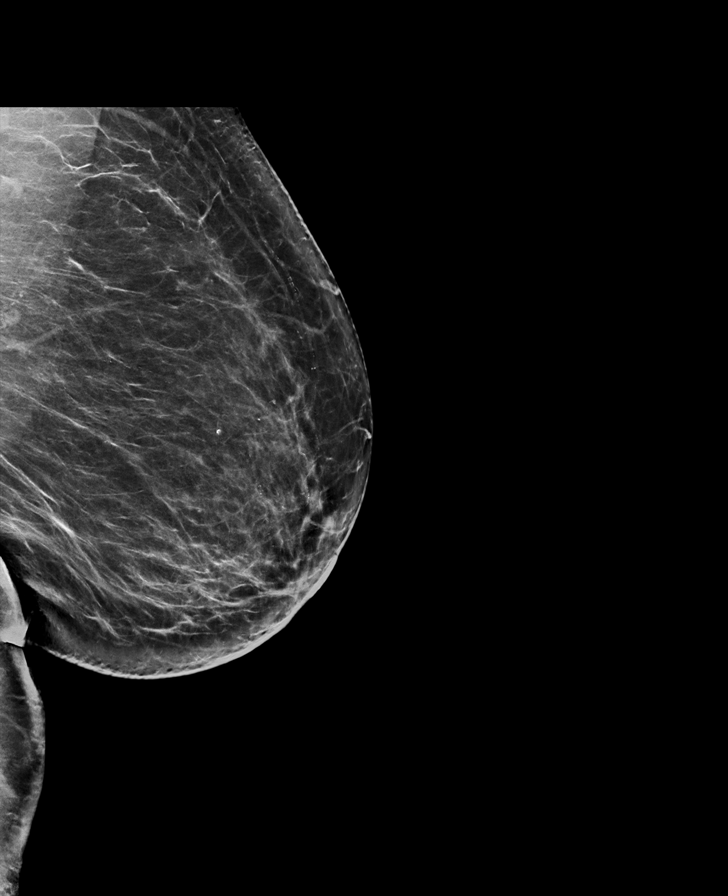

[R MLO synth-2D]
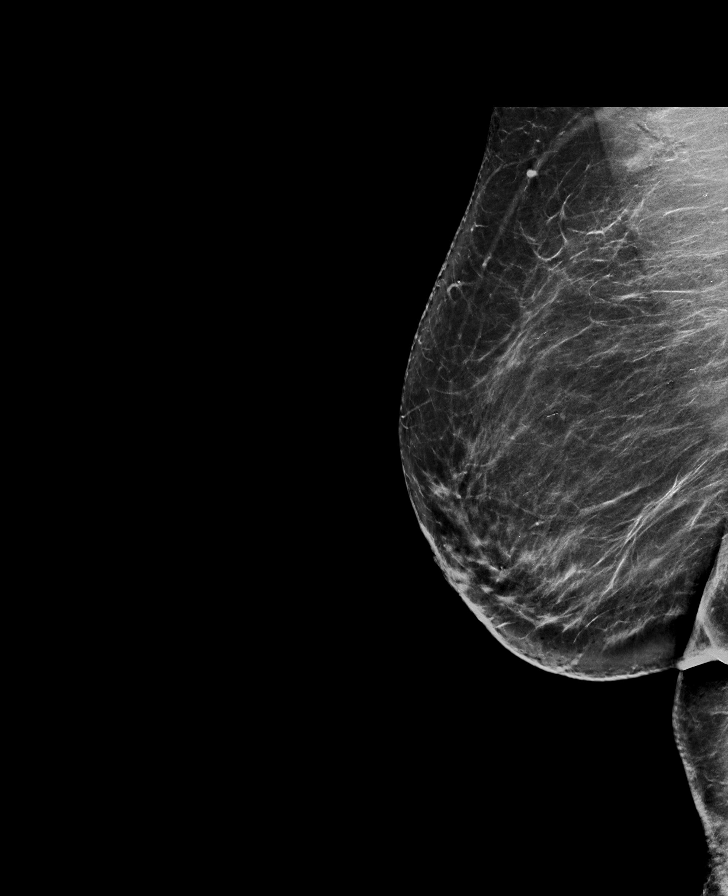

[R CC synth-2D]
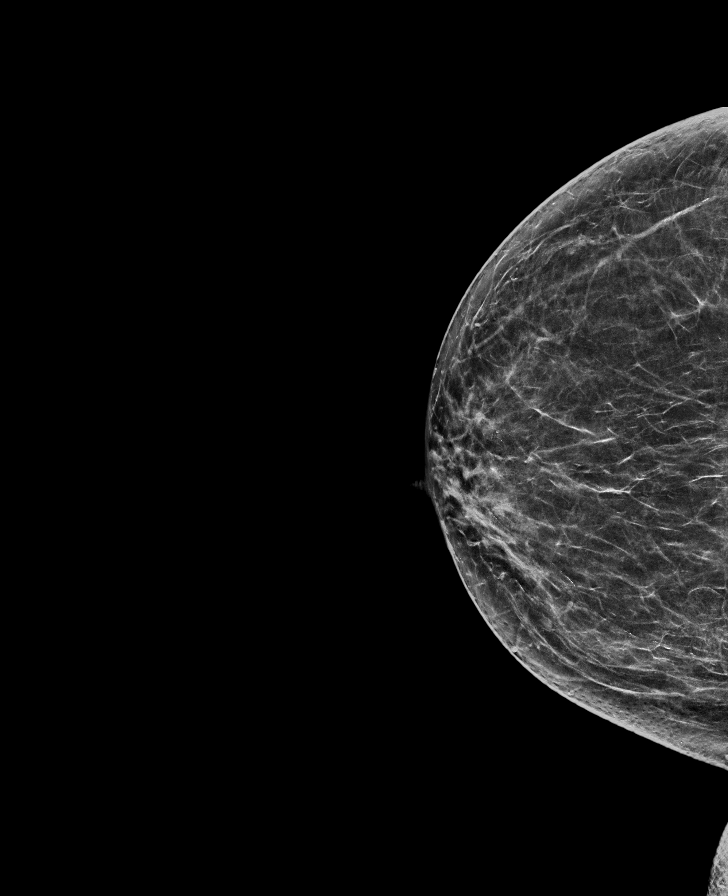

[L CC synth-2D]
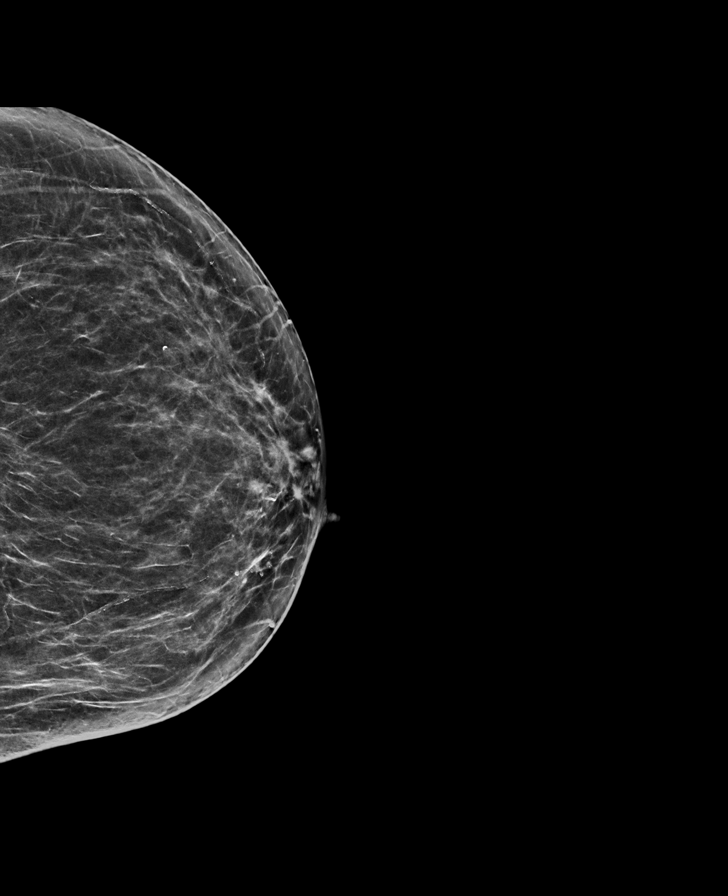

[R MLO tomo · tomo slice 41/81.0]
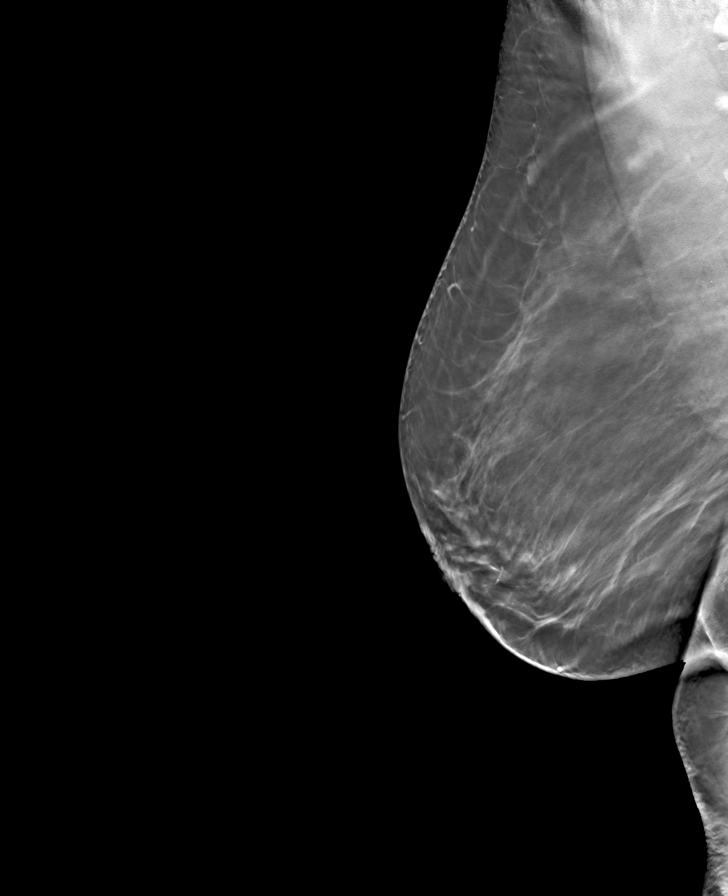

[R CC tomo · tomo slice 33/64.0]
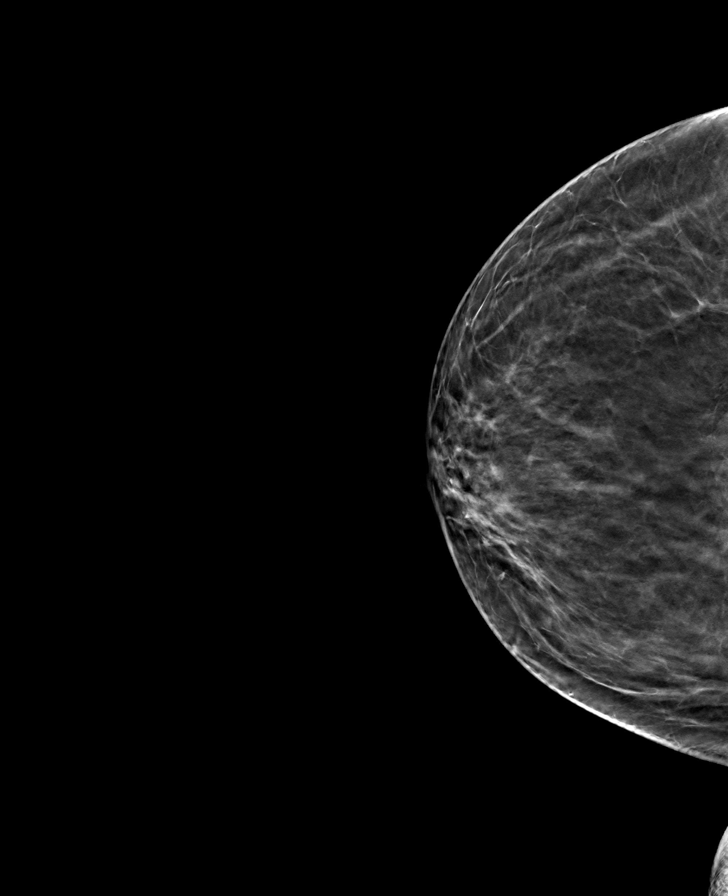

[L CC tomo · tomo slice 34/67.0]
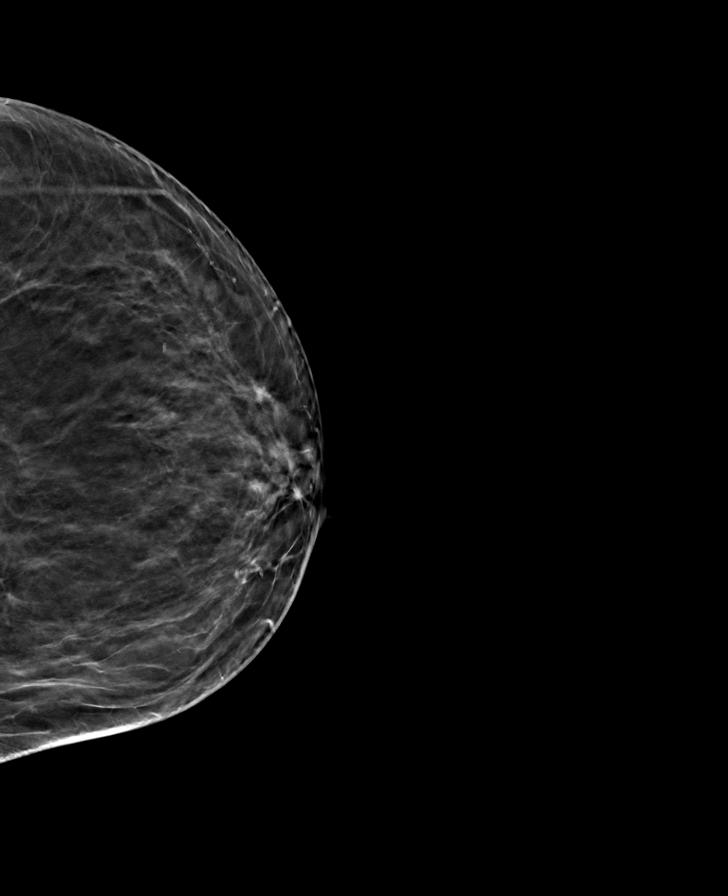

[L MLO tomo · tomo slice 42/83.0]
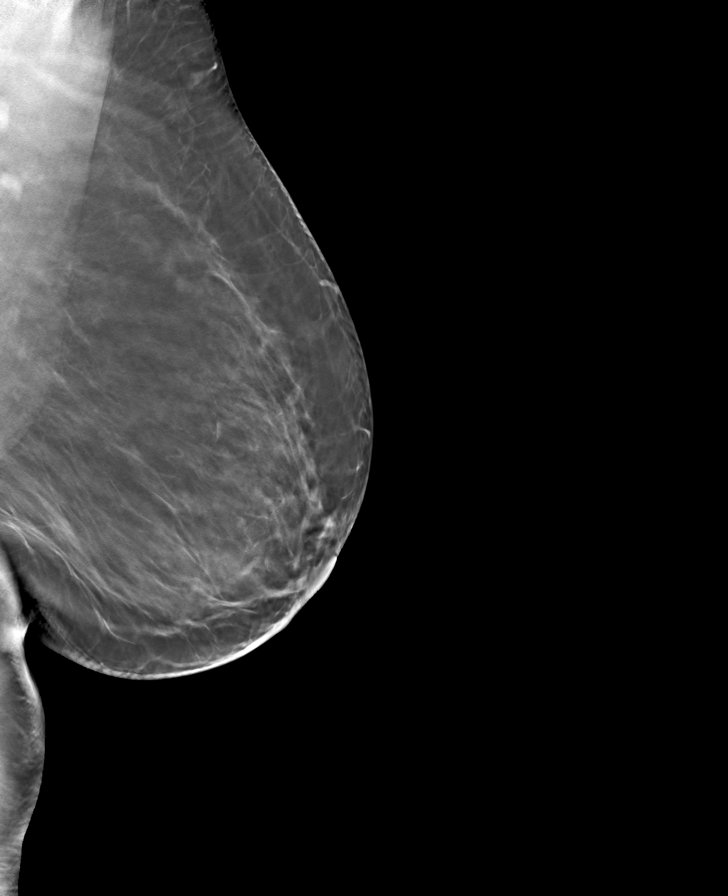

[8 of 24 positions shown; findings below may reference images not displayed]

ACR Breast Density Category b: There are scattered areas of
fibroglandular density.
FINDINGS: There are no findings suspicious for malignancy. The images were
evaluated with computer-aided detection.
IMPRESSION: No mammographic evidence of malignancy. A result letter of this
screening mammogram will be mailed directly to the patient.

RECOMMENDATION:
Screening mammogram in one year. (Code:[id number])

BI-RADS CATEGORY  1: Negative.

## 2021-04-15 ENCOUNTER — Encounter (INDEPENDENT_AMBULATORY_CARE_PROVIDER_SITE_OTHER): Payer: Self-pay

## 2021-04-15 ENCOUNTER — Telehealth (INDEPENDENT_AMBULATORY_CARE_PROVIDER_SITE_OTHER): Payer: Self-pay

## 2021-04-15 ENCOUNTER — Ambulatory Visit (INDEPENDENT_AMBULATORY_CARE_PROVIDER_SITE_OTHER): Payer: BC Managed Care – PPO | Admitting: Gastroenterology

## 2021-04-15 ENCOUNTER — Other Ambulatory Visit (INDEPENDENT_AMBULATORY_CARE_PROVIDER_SITE_OTHER): Payer: Self-pay

## 2021-04-15 ENCOUNTER — Encounter (INDEPENDENT_AMBULATORY_CARE_PROVIDER_SITE_OTHER): Payer: Self-pay | Admitting: Gastroenterology

## 2021-04-15 ENCOUNTER — Other Ambulatory Visit: Payer: Self-pay

## 2021-04-15 DIAGNOSIS — K921 Melena: Secondary | ICD-10-CM | POA: Insufficient documentation

## 2021-04-15 DIAGNOSIS — R1013 Epigastric pain: Secondary | ICD-10-CM | POA: Diagnosis not present

## 2021-04-15 LAB — CBC WITH DIFFERENTIAL/PLATELET
Absolute Monocytes: 442 cells/uL (ref 200–950)
Basophils Absolute: 47 cells/uL (ref 0–200)
Basophils Relative: 0.7 %
Eosinophils Absolute: 121 cells/uL (ref 15–500)
Eosinophils Relative: 1.8 %
HCT: 43.9 % (ref 35.0–45.0)
Hemoglobin: 14 g/dL (ref 11.7–15.5)
Lymphs Abs: 2164 cells/uL (ref 850–3900)
MCH: 28.4 pg (ref 27.0–33.0)
MCHC: 31.9 g/dL — ABNORMAL LOW (ref 32.0–36.0)
MCV: 89 fL (ref 80.0–100.0)
MPV: 10.8 fL (ref 7.5–12.5)
Monocytes Relative: 6.6 %
Neutro Abs: 3926 cells/uL (ref 1500–7800)
Neutrophils Relative %: 58.6 %
Platelets: 308 10*3/uL (ref 140–400)
RBC: 4.93 10*6/uL (ref 3.80–5.10)
RDW: 13.1 % (ref 11.0–15.0)
Total Lymphocyte: 32.3 %
WBC: 6.7 10*3/uL (ref 3.8–10.8)

## 2021-04-15 MED ORDER — CLENPIQ 10-3.5-12 MG-GM -GM/160ML PO SOLN: 1.0000 | Freq: Once | ORAL | 0 refills | Status: AC

## 2021-04-15 NOTE — Telephone Encounter (Signed)
LeighAnn Ludivina Guymon, CMA  

## 2021-04-15 NOTE — Patient Instructions (Addendum)
Schedule EGD and colonoscopy Avoid using NSAIDs such as Aleve, ibuprofen, naproxen, Motrin, Voltaren or Advil (even the topical ones)

## 2021-04-15 NOTE — Progress Notes (Addendum)
Maylon Peppers, M.D. Gastroenterology & Hepatology [facility] [address] [place], [place] [address] Primary Care Physician: Sharilyn Sites, MD [address] [place] [address]  Referring MD: PCP  Chief Complaint:  abdominal pain and possible melena  History of Present Illness: Shelley Bautista is a 65 y.o. female with PMH HTN , HLD, who presents for evaluation of abdominal pain and possible melena.  Patient reports that in the last 3 months she has presented some episodes of very black stools. Believes she has presented this a couple of times and is concerned about this. She has also noted some episodes of intermittent epigastric pain and bloating in this region. States that the pain is characterized as a pressure that does not radiate anywhere else. She has also felt some nausea when she has the pain. Denies any hematochezia. The patient reports that she was taking ibuprofen when the pain started but was advised by her PCP to stop taking it. Has not taken any ibupfoen in the last 3 months. Was taking ibuprofen for the last year for headaches and joint pain, was not taking it daily but only as needed. Denies intake of Goody powders/BC powders, anticoagulants, high dose aspirin, or any other antiplatelet.  The patient denies having any vomiting, fever, chills, hematochezia, melena, hematemesis, iarrhea, jaundice, pruritus. Has lost a couple of lb as she has been walking often.  Last HQP:RFFMB Last Colonoscopy:>10 years ago, normal per the patient  FHx: neg for any gastrointestinal/liver disease, no malignancies Social: neg smoking, alcohol or illicit drug use  Past Medical History: Past Medical History:  Diagnosis Date   Atypical chest pain    Headache    Heart murmur    a. benign -  2D Echo 01/02/18 showed EF 55-60%, grade 1 DD, mild AI, trivial MR/TR, PASP 42mmHg, CVP 3.    High cholesterol    Hypertension     Post-tussive syncope     Past Surgical History: Past Surgical History:  Procedure Laterality Date   CYSTECTOMY     Ovary    Family History: Family History  Problem Relation Age of Onset   Cancer Mother    Other Father        blood clot    Social History: Social History   Tobacco Use  Smoking Status Never  Smokeless Tobacco Never   Social History   Substance and Sexual Activity  Alcohol Use Yes   Comment: one beer to help sleep about 3 days a week   Social History   Substance and Sexual Activity  Drug Use No    Allergies: No Known Allergies  Medications: Current Outpatient Medications  Medication Sig Dispense Refill   amLODipine (NORVASC) 5 MG tablet Take 1 tablet (5 mg total) by mouth daily. 90 tablet 3   atorvastatin (LIPITOR) 20 MG tablet Take 20 mg by mouth daily.  1   losartan-hydrochlorothiazide (HYZAAR) 100-25 MG per tablet Take 1 tablet by mouth 2 (two) times daily.     nitroGLYCERIN (NITROSTAT) 0.4 MG SL tablet Place 1 tablet (0.4 mg total) under the tongue every 5 (five) minutes as needed for chest pain. 30 tablet 0   pantoprazole (PROTONIX) 20 MG tablet Take 1 tablet (20 mg total) by mouth daily. 30 tablet 0   No current facility-administered medications for this visit.    Review of Systems: GENERAL: negative for malaise, night sweats HEENT: No changes in hearing or vision, no nose bleeds or other nasal problems.  NECK: Negative for lumps, goiter, pain and significant neck swelling RESPIRATORY: Negative for cough, wheezing CARDIOVASCULAR: Negative for chest pain, leg swelling, palpitations, orthopnea GI: SEE HPI MUSCULOSKELETAL: Negative for joint pain or swelling, back pain, and muscle pain. SKIN: Negative for lesions, rash PSYCH: Negative for sleep disturbance, mood disorder and recent psychosocial stressors. HEMATOLOGY Negative for prolonged bleeding, bruising easily, and swollen nodes. ENDOCRINE: Negative for cold or heat intolerance,  polyuria, polydipsia and goiter. NEURO: negative for tremor, gait imbalance, syncope and seizures. The remainder of the review of systems is noncontributory.   Physical Exam: BP (!) 174/109 (BP Location: Right Arm, Patient Position: Sitting, Cuff Size: Large) Comment: pt's sister passed last night and has been up  Pulse 91   Temp 98.8 F (37.1 C) (Oral)   Ht 4\' 11"  (1.499 m)   Wt 145 lb (65.8 kg)   BMI 29.29 kg/m  GENERAL: The patient is AO x3, in no acute distress. HEENT: Head is normocephalic and atraumatic. EOMI are intact. Mouth is well hydrated and without lesions. NECK: Supple. No masses LUNGS: Clear to auscultation. No presence of rhonchi/wheezing/rales. Adequate chest expansion HEART: RRR, normal s1 and s2. ABDOMEN: tender to palpation in the epigastric area, no guarding, no peritoneal signs, and nondistended. BS +. No masses. EXTREMITIES: Without any cyanosis, clubbing, rash, lesions or edema. NEUROLOGIC: AOx3, no focal motor deficit. SKIN: no jaundice, no rashes   Imaging/Labs: as above  I personally reviewed and interpreted the available labs, imaging and endoscopic files.  Impression and Plan: Shelley Bautista is a 65 y.o. female with PMH HTN , HLD, who presents for evaluation of abdominal pain and possible melena.  The patient has presented some intermittent episodes of epigastric pain in the setting of chronic use of ibuprofen, which may increase her risk for peptic ulcer disease.  She has not presented any other red flag signs or symptoms that would point towards exact etiology of her symptoms.  Due to this, we will need to explore her symptoms further with an EGD.  I strongly advised her to avoid NSAIDs, she can take some Tylenol if needed for pain.  We will obtain a CBC today to rule out any significant drop in her hemoglobin, it is unclear if she has an upper gastrointestinal bleeding.  The patient understood and agreed.  We will also schedule a colonoscopy for  colorectal cancer screening.  - Schedule EGD and colonoscopy - Check CBC - Avoid using NSAIDs such as Aleve, ibuprofen, naproxen, Motrin, Voltaren or Advil (even the topical ones)  All questions were answered.      Maylon Peppers, MD Gastroenterology and Hepatology [facility]

## 2021-05-22 ENCOUNTER — Ambulatory Visit (HOSPITAL_COMMUNITY): Payer: BC Managed Care – PPO | Admitting: Anesthesiology

## 2021-05-22 ENCOUNTER — Ambulatory Visit (HOSPITAL_COMMUNITY)
Admission: RE | Admit: 2021-05-22 | Discharge: 2021-05-22 | Disposition: A | Discharge status: Home / Self Care | Payer: BC Managed Care – PPO | Attending: Gastroenterology | Admitting: Gastroenterology

## 2021-05-22 ENCOUNTER — Encounter (HOSPITAL_COMMUNITY)
Admission: RE | Disposition: A | Discharge status: Home / Self Care | Payer: Self-pay | Source: Home / Self Care | Attending: Gastroenterology

## 2021-05-22 ENCOUNTER — Other Ambulatory Visit: Payer: Self-pay

## 2021-05-22 ENCOUNTER — Encounter (HOSPITAL_COMMUNITY): Payer: Self-pay | Admitting: Gastroenterology

## 2021-05-22 DIAGNOSIS — K2950 Unspecified chronic gastritis without bleeding: Secondary | ICD-10-CM | POA: Diagnosis not present

## 2021-05-22 DIAGNOSIS — Z1211 Encounter for screening for malignant neoplasm of colon: Secondary | ICD-10-CM | POA: Diagnosis not present

## 2021-05-22 DIAGNOSIS — K648 Other hemorrhoids: Secondary | ICD-10-CM | POA: Diagnosis not present

## 2021-05-22 DIAGNOSIS — E7800 Pure hypercholesterolemia, unspecified: Secondary | ICD-10-CM | POA: Diagnosis not present

## 2021-05-22 DIAGNOSIS — K31A19 Gastric intestinal metaplasia without dysplasia, unspecified site: Secondary | ICD-10-CM | POA: Diagnosis not present

## 2021-05-22 DIAGNOSIS — D128 Benign neoplasm of rectum: Secondary | ICD-10-CM | POA: Diagnosis not present

## 2021-05-22 DIAGNOSIS — K2980 Duodenitis without bleeding: Secondary | ICD-10-CM | POA: Insufficient documentation

## 2021-05-22 DIAGNOSIS — D123 Benign neoplasm of transverse colon: Secondary | ICD-10-CM

## 2021-05-22 DIAGNOSIS — K3189 Other diseases of stomach and duodenum: Secondary | ICD-10-CM

## 2021-05-22 DIAGNOSIS — Z79899 Other long term (current) drug therapy: Secondary | ICD-10-CM | POA: Diagnosis not present

## 2021-05-22 DIAGNOSIS — K259 Gastric ulcer, unspecified as acute or chronic, without hemorrhage or perforation: Secondary | ICD-10-CM | POA: Diagnosis not present

## 2021-05-22 DIAGNOSIS — K921 Melena: Secondary | ICD-10-CM | POA: Diagnosis not present

## 2021-05-22 DIAGNOSIS — K529 Noninfective gastroenteritis and colitis, unspecified: Secondary | ICD-10-CM | POA: Insufficient documentation

## 2021-05-22 DIAGNOSIS — R1013 Epigastric pain: Secondary | ICD-10-CM

## 2021-05-22 DIAGNOSIS — K635 Polyp of colon: Secondary | ICD-10-CM | POA: Diagnosis not present

## 2021-05-22 DIAGNOSIS — I10 Essential (primary) hypertension: Secondary | ICD-10-CM | POA: Diagnosis not present

## 2021-05-22 DIAGNOSIS — B9681 Helicobacter pylori [H. pylori] as the cause of diseases classified elsewhere: Secondary | ICD-10-CM | POA: Diagnosis not present

## 2021-05-22 HISTORY — PX: BIOPSY: SHX5522

## 2021-05-22 HISTORY — PX: ESOPHAGOGASTRODUODENOSCOPY (EGD) WITH PROPOFOL: SHX5813

## 2021-05-22 HISTORY — PX: POLYPECTOMY: SHX5525

## 2021-05-22 HISTORY — PX: COLONOSCOPY WITH PROPOFOL: SHX5780

## 2021-05-22 LAB — HM COLONOSCOPY

## 2021-05-22 SURGERY — COLONOSCOPY WITH PROPOFOL
Anesthesia: General

## 2021-05-22 MED ORDER — STERILE WATER FOR IRRIGATION IR SOLN
Status: DC | PRN
  Administered 2021-05-22: 200 mL

## 2021-05-22 MED ORDER — LIDOCAINE HCL (CARDIAC) PF 100 MG/5ML IV SOSY
PREFILLED_SYRINGE | INTRAVENOUS | Status: DC | PRN
  Administered 2021-05-22: 50 mg via INTRAVENOUS

## 2021-05-22 MED ORDER — PROPOFOL 500 MG/50ML IV EMUL
INTRAVENOUS | Status: DC | PRN
  Administered 2021-05-22: 150 ug/kg/min via INTRAVENOUS

## 2021-05-22 MED ORDER — OMEPRAZOLE 20 MG PO CPDR: 20.0000 mg | DELAYED_RELEASE_CAPSULE | Freq: Every day | ORAL | 0 refills | Status: DC

## 2021-05-22 MED ORDER — LACTATED RINGERS IV SOLN: INTRAVENOUS | Status: DC

## 2021-05-22 MED ORDER — PROPOFOL 10 MG/ML IV BOLUS
INTRAVENOUS | Status: DC | PRN
  Administered 2021-05-22 (×2): 50 mg via INTRAVENOUS
  Administered 2021-05-22: 100 mg via INTRAVENOUS

## 2021-05-22 NOTE — Anesthesia Procedure Notes (Signed)
Date/Time: 05/22/2021 8:19 AM Performed by: Orlie Dakin, CRNA Pre-anesthesia Checklist: Patient identified, Emergency Drugs available, Suction available and Patient being monitored Patient Re-evaluated:Patient Re-evaluated prior to induction Oxygen Delivery Method: Nasal cannula Induction Type: IV induction Placement Confirmation: positive ETCO2

## 2021-05-22 NOTE — Transfer of Care (Signed)
Immediate Anesthesia Transfer of Care Note  Patient: Shelley Bautista  Procedure(s) Performed: COLONOSCOPY WITH PROPOFOL ESOPHAGOGASTRODUODENOSCOPY (EGD) WITH PROPOFOL BIOPSY POLYPECTOMY  Patient Location: Endoscopy Unit  Anesthesia Type:General  Level of Consciousness: awake  Airway & Oxygen Therapy: Patient Spontanous Breathing  Post-op Assessment: Report given to RN and Post -op Vital signs reviewed and stable  Post vital signs: Reviewed and stable  Last Vitals:  Vitals Value Taken Time  BP    Temp    Pulse    Resp    SpO2      Last Pain:  Vitals:   05/22/21 0813  TempSrc:   PainSc: 0-No pain      Patients Stated Pain Goal: 6 ([id number])  Complications: No notable events documented.

## 2021-05-22 NOTE — Anesthesia Postprocedure Evaluation (Signed)
Anesthesia Post Note  Patient: Shelley Bautista  Procedure(s) Performed: COLONOSCOPY WITH PROPOFOL ESOPHAGOGASTRODUODENOSCOPY (EGD) WITH PROPOFOL BIOPSY POLYPECTOMY  Patient location during evaluation: Endoscopy Anesthesia Type: General Level of consciousness: awake and alert and oriented Pain management: pain level controlled Vital Signs Assessment: post-procedure vital signs reviewed and stable Respiratory status: spontaneous breathing and respiratory function stable Cardiovascular status: blood pressure returned to baseline and stable Postop Assessment: no apparent nausea or vomiting Anesthetic complications: no   No notable events documented.   Last Vitals:  Vitals:   05/22/21 0701 05/22/21 0902  BP: (!) 165/73 111/73  Pulse: 66 97  Resp: 14 14  Temp: 36.9 C 36.5 C  SpO2: 100% 97%    Last Pain:  Vitals:   05/22/21 0902  TempSrc: Axillary  PainSc: 0-No pain                 [name] C [name]

## 2021-05-22 NOTE — Discharge Instructions (Addendum)
You are being discharged to home.  Resume your previous diet.  We are waiting for your pathology results.  Take Prilosec (omeprazole) 20 mg by mouth once a day for three months.  Do not take any ibuprofen (including Advil, Motrin or Nuprin), naproxen, or other non-steroidal anti-inflammatory drugs.  Your physician has recommended a repeat colonoscopy in three years for surveillance.

## 2021-05-22 NOTE — H&P (Signed)
Shelley Bautista is an 65 y.o. female.   Chief Complaint: Epigastric pain and melena, colorectal cancer screening HPI: 65 y.o. female with PMH HTN , HLD, who presents for evaluation of abdominal pain and possible melena, as well as colorectal cancer screening.  The patient reports that she had intermittent episodes of melena in the past 4 months.  Has not presented a recent 1 since the last time she was seen in the office.  She was treated taking ibuprofen but has stopped taking this medication.  Also had some pain in the epigastric area and bloating intermittently which has been getting better after she has modified her diet.  Denies any nausea, vomiting, fever, chills, hematochezia or weight loss.  Past Medical History:  Diagnosis Date   Atypical chest pain    Headache    Heart murmur    a. benign -  2D Echo 01/02/18 showed EF 55-60%, grade 1 DD, mild AI, trivial MR/TR, PASP 64mHg, CVP 3.    High cholesterol    Hypertension    Post-tussive syncope     Past Surgical History:  Procedure Laterality Date   CYSTECTOMY     Ovary    Family History  Problem Relation Age of Onset   Cancer Mother    Other Father        blood clot   Social History:  reports that she has never smoked. She has never used smokeless tobacco. She reports current alcohol use. She reports that she does not use drugs.  Allergies: No Known Allergies  Medications Prior to Admission  Medication Sig Dispense Refill   amLODipine (NORVASC) 5 MG tablet Take 1 tablet (5 mg total) by mouth daily. (Patient taking differently: Take 5 mg by mouth in the morning.) 90 tablet 3   nitroGLYCERIN (NITROSTAT) 0.4 MG SL tablet Place 1 tablet (0.4 mg total) under the tongue every 5 (five) minutes as needed for chest pain. 30 tablet 0   zolpidem (AMBIEN) 10 MG tablet Take 10 mg by mouth at bedtime as needed (insomnia (due to 3rd shift)).      No results found for this or any previous visit (from the past 48 hour(s)). No results  found.  Review of Systems  Constitutional: Negative.   HENT: Negative.    Eyes: Negative.   Respiratory: Negative.    Cardiovascular: Negative.   Gastrointestinal:  Positive for abdominal pain.  Endocrine: Negative.   Genitourinary: Negative.   Musculoskeletal: Negative.   Skin: Negative.   Allergic/Immunologic: Negative.   Neurological: Negative.   Hematological: Negative.   Psychiatric/Behavioral: Negative.     Blood pressure (!) 165/73, pulse 66, temperature 98.4 F (36.9 C), temperature source Oral, resp. rate 14, height '4\' 11"'$  (1.499 m), weight 65.8 kg, SpO2 100 %. Physical Exam  GENERAL: The patient is AO x3, in no acute distress. HEENT: Head is normocephalic and atraumatic. EOMI are intact. Mouth is well hydrated and without lesions. NECK: Supple. No masses LUNGS: Clear to auscultation. No presence of rhonchi/wheezing/rales. Adequate chest expansion HEART: RRR, normal s1 and s2. ABDOMEN: Soft, nontender, no guarding, no peritoneal signs, and nondistended. BS +. No masses. EXTREMITIES: Without any cyanosis, clubbing, rash, lesions or edema. NEUROLOGIC: AOx3, no focal motor deficit. SKIN: no jaundice, no rashes  Assessment/Plan 65y.o. female with PMH HTN , HLD, who presents for evaluation of abdominal pain and possible melena, as well as colorectal cancer screening.  We will proceed with EGD and colonoscopy.  DHarvel Quale MD 05/22/2021, 7:27 AM

## 2021-05-22 NOTE — Anesthesia Preprocedure Evaluation (Addendum)
Anesthesia Evaluation  Patient identified by MRN, date of birth, ID band Patient awake    Reviewed: Allergy & Precautions, NPO status , Patient's Chart, lab work & pertinent test results  Airway Mallampati: I  TM Distance: >3 FB Neck ROM: Full    Dental  (+) Dental Advisory Given, Teeth Intact   Pulmonary neg pulmonary ROS,    Pulmonary exam normal breath sounds clear to auscultation       Cardiovascular Exercise Tolerance: Good hypertension, Pt. on medications + Valvular Problems/Murmurs  Rhythm:Regular Rate:Normal + Systolic murmurs    Neuro/Psych  Headaches, negative psych ROS   GI/Hepatic negative GI ROS, Neg liver ROS,   Endo/Other  negative endocrine ROS  Renal/GU negative Renal ROS     Musculoskeletal negative musculoskeletal ROS (+)   Abdominal   Peds  Hematology negative hematology ROS (+)   Anesthesia Other Findings   Reproductive/Obstetrics negative OB ROS                            Anesthesia Physical Anesthesia Plan  ASA: 2  Anesthesia Plan: General   Post-op Pain Management:    Induction: Intravenous  PONV Risk Score and Plan: Propofol infusion  Airway Management Planned: Nasal Cannula and Natural Airway  Additional Equipment:   Intra-op Plan:   Post-operative Plan:   Informed Consent: I have reviewed the patients History and Physical, chart, labs and discussed the procedure including the risks, benefits and alternatives for the proposed anesthesia with the patient or authorized representative who has indicated his/her understanding and acceptance.     Dental advisory given  Plan Discussed with: CRNA and Surgeon  Anesthesia Plan Comments:         Anesthesia Quick Evaluation

## 2021-05-22 NOTE — Op Note (Signed)
[facility] Patient Name: Shelley Bautista Procedure Date: 05/22/2021 8:06 AM MRN: [id number] Date of Birth: [birth year] Attending MD: Maylon Peppers ,  CSN: [id number] Age: 65 Admit Type: Outpatient Procedure:                Upper GI endoscopy Indications:              Epigastric abdominal pain, Melena Providers:                Maylon Peppers, Caprice Kluver, Raphael Gibney,                            Technician Referring MD:              Medicines:                Monitored Anesthesia Care Complications:            No immediate complications. Estimated Blood Loss:     Estimated blood loss: none. Procedure:                Pre-Anesthesia Assessment:                           - Prior to the procedure, a History and Physical                            was performed, and patient medications, allergies                            and sensitivities were reviewed. The patient's                            tolerance of previous anesthesia was reviewed.                           - The risks and benefits of the procedure and the                            sedation options and risks were discussed with the                            patient. All questions were answered and informed                            consent was obtained.                           - ASA Grade Assessment: II - A patient with mild                            systemic disease.                           After obtaining informed consent, the endoscope was                            passed under direct vision. Throughout the  procedure, the patient's blood pressure, pulse, and                            oxygen saturations were monitored continuously. The                            [id number] scope was introduced through the                            mouth, and advanced to the second part of duodenum.                            The upper GI endoscopy was accomplished without                             difficulty. The patient tolerated the procedure                            well. Scope In: 8:17:20 AM Scope Out: 8:25:48 AM Total Procedure Duration: 0 hours 8 minutes 28 seconds  Findings:      The examined esophagus was normal.      Two localized healed erosions with no stigmata of recent bleeding were       found in the gastric antrum. Biopsies from body and antrum were taken       with a cold forceps for Helicobacter pylori testing.      Diffuse atrophic mucosa was found in the gastric antrum.      The examined duodenum was normal. Biopsies were taken with a cold       forceps for histology. Impression:               - Normal esophagus.                           - Gastric erosions with no stigmata of recent                            bleeding. Biopsied.                           - Gastric mucosal atrophy.                           - Normal examined duodenum. Biopsied. Moderate Sedation:      Per Anesthesia Care Recommendation:           - Discharge patient to home (ambulatory).                           - Resume previous diet.                           - Await pathology results.                           - Use Prilosec (omeprazole) 20 mg PO daily for 3  months.                           - No ibuprofen, naproxen, or other non-steroidal                            anti-inflammatory drugs. Procedure Code(s):        --- Professional ---                           [phone], Esophagogastroduodenoscopy, flexible,                            transoral; with biopsy, single or multiple Diagnosis Code(s):        --- Professional ---                           K25.9, Gastric ulcer, unspecified as acute or                            chronic, without hemorrhage or perforation                           K31.89, Other diseases of stomach and duodenum                           R10.13, Epigastric pain                           K92.1, Melena (includes Hematochezia) CPT  copyright 2019 American Medical Association. All rights reserved. The codes documented in this report are preliminary and upon coder review may  be revised to meet current compliance requirements. Maylon Peppers, MD Maylon Peppers,  05/22/2021 8:29:22 AM This report has been signed electronically. Number of Addenda: 0

## 2021-05-22 NOTE — Op Note (Signed)
[facility] Patient Name: Shelley Bautista Procedure Date: 05/22/2021 8:29 AM MRN: [id number] Date of Birth: 23-Dec-1955 Attending MD: Maylon Peppers ,  CSN: [id number] Age: 65 Admit Type: Outpatient Procedure:                Colonoscopy Indications:              Screening for colorectal malignant neoplasm Providers:                Maylon Peppers, Caprice Kluver, Raphael Gibney,                            Technician Referring MD:              Medicines:                Monitored Anesthesia Care Complications:            No immediate complications. Estimated Blood Loss:     Estimated blood loss: none. Procedure:                Pre-Anesthesia Assessment:                           - Prior to the procedure, a History and Physical                            was performed, and patient medications, allergies                            and sensitivities were reviewed. The patient's                            tolerance of previous anesthesia was reviewed.                           - The risks and benefits of the procedure and the                            sedation options and risks were discussed with the                            patient. All questions were answered and informed                            consent was obtained.                           - ASA Grade Assessment: II - A patient with mild                            systemic disease.                           After obtaining informed consent, the colonoscope                            was passed under direct vision. Throughout the  procedure, the patient's blood pressure, pulse, and                            oxygen saturations were monitored continuously. The                            [id number] scope was introduced through                            the anus and advanced to the the cecum, identified                            by appendiceal orifice and ileocecal valve. The                             colonoscopy was performed without difficulty. The                            patient tolerated the procedure well. The quality                            of the bowel preparation was good. Scope In: 8:30:56 AM Scope Out: 8:58:10 AM Scope Withdrawal Time: 0 hours 17 minutes 13 seconds  Total Procedure Duration: 0 hours 27 minutes 14 seconds  Findings:      The perianal and digital rectal examinations were normal.      A 6 mm polyp was found in the hepatic flexure. The polyp was sessile.       The polyp was removed with a cold snare. Resection and retrieval were       complete.      A 15 mm polyp was found in the rectum. The polyp was semi-sessile. The       polyp was removed with a hot snare. Resection and retrieval were       complete.      Non-bleeding internal hemorrhoids were found during retroflexion. The       hemorrhoids were small. Impression:               - One 6 mm polyp at the hepatic flexure, removed                            with a cold snare. Resected and retrieved.                           - One 15 mm polyp in the rectum, removed with a hot                            snare. Resected and retrieved.                           - Non-bleeding internal hemorrhoids. Moderate Sedation:      Per Anesthesia Care Recommendation:           - Discharge patient to home (ambulatory).                           -  Resume previous diet.                           - Await pathology results.                           - Repeat colonoscopy in 3 years for surveillance. Procedure Code(s):        --- Professional ---                           [phone], Colonoscopy, flexible; with removal of                            tumor(s), polyp(s), or other lesion(s) by snare                            technique Diagnosis Code(s):        --- Professional ---                           Z12.11, Encounter for screening for malignant                            neoplasm of colon                            K63.5, Polyp of colon                           K62.1, Rectal polyp                           K64.8, Other hemorrhoids CPT copyright 2019 American Medical Association. All rights reserved. The codes documented in this report are preliminary and upon coder review may  be revised to meet current compliance requirements. Maylon Peppers, MD Maylon Peppers,  05/22/2021 9:02:24 AM This report has been signed electronically. Number of Addenda: 0

## 2021-05-25 ENCOUNTER — Other Ambulatory Visit (INDEPENDENT_AMBULATORY_CARE_PROVIDER_SITE_OTHER): Payer: Self-pay | Admitting: Gastroenterology

## 2021-05-25 DIAGNOSIS — A048 Other specified bacterial intestinal infections: Secondary | ICD-10-CM

## 2021-05-25 LAB — SURGICAL PATHOLOGY

## 2021-05-25 MED ORDER — TETRACYCLINE HCL 500 MG PO CAPS: 500.0000 mg | ORAL_CAPSULE | Freq: Four times a day (QID) | ORAL | 0 refills | Status: AC

## 2021-05-25 MED ORDER — METRONIDAZOLE 500 MG PO TABS: 500.0000 mg | ORAL_TABLET | Freq: Three times a day (TID) | ORAL | 0 refills | Status: AC

## 2021-05-25 MED ORDER — BISMUTH 262 MG PO CHEW: 2.0000 | CHEWABLE_TABLET | Freq: Four times a day (QID) | ORAL | 0 refills | Status: AC

## 2021-06-02 ENCOUNTER — Encounter (HOSPITAL_COMMUNITY): Payer: Self-pay | Admitting: Gastroenterology

## 2021-06-09 ENCOUNTER — Encounter (INDEPENDENT_AMBULATORY_CARE_PROVIDER_SITE_OTHER): Payer: Self-pay | Admitting: *Deleted

## 2021-07-24 ENCOUNTER — Encounter (INDEPENDENT_AMBULATORY_CARE_PROVIDER_SITE_OTHER): Payer: Self-pay

## 2021-07-24 ENCOUNTER — Other Ambulatory Visit (INDEPENDENT_AMBULATORY_CARE_PROVIDER_SITE_OTHER): Payer: Self-pay

## 2021-07-24 DIAGNOSIS — R1013 Epigastric pain: Secondary | ICD-10-CM

## 2021-07-24 DIAGNOSIS — A048 Other specified bacterial intestinal infections: Secondary | ICD-10-CM

## 2021-07-24 DIAGNOSIS — K921 Melena: Secondary | ICD-10-CM

## 2021-08-17 ENCOUNTER — Other Ambulatory Visit (INDEPENDENT_AMBULATORY_CARE_PROVIDER_SITE_OTHER): Payer: Self-pay | Admitting: Gastroenterology

## 2021-08-17 NOTE — Telephone Encounter (Signed)
Last seen 04/15/2021 Melena.

## 2021-08-18 ENCOUNTER — Telehealth (INDEPENDENT_AMBULATORY_CARE_PROVIDER_SITE_OTHER): Payer: Self-pay

## 2021-08-18 NOTE — Telephone Encounter (Signed)
08/11/2021: I called and left a vm to have her test preformed this week, to call if any questions.  07/24/2021:I mailed the order and letter to the patient.

## 2021-10-28 DIAGNOSIS — E669 Obesity, unspecified: Secondary | ICD-10-CM | POA: Diagnosis not present

## 2021-10-28 DIAGNOSIS — E119 Type 2 diabetes mellitus without complications: Secondary | ICD-10-CM | POA: Diagnosis not present

## 2021-10-28 DIAGNOSIS — F4321 Adjustment disorder with depressed mood: Secondary | ICD-10-CM | POA: Diagnosis not present

## 2021-10-28 DIAGNOSIS — I10 Essential (primary) hypertension: Secondary | ICD-10-CM | POA: Diagnosis not present

## 2021-10-28 DIAGNOSIS — N390 Urinary tract infection, site not specified: Secondary | ICD-10-CM | POA: Diagnosis not present

## 2021-10-28 DIAGNOSIS — Z0001 Encounter for general adult medical examination with abnormal findings: Secondary | ICD-10-CM | POA: Diagnosis not present

## 2021-10-28 DIAGNOSIS — R079 Chest pain, unspecified: Secondary | ICD-10-CM | POA: Diagnosis not present

## 2021-10-28 DIAGNOSIS — Z6829 Body mass index (BMI) 29.0-29.9, adult: Secondary | ICD-10-CM | POA: Diagnosis not present

## 2021-11-16 ENCOUNTER — Other Ambulatory Visit (INDEPENDENT_AMBULATORY_CARE_PROVIDER_SITE_OTHER): Payer: Self-pay | Admitting: Gastroenterology

## 2022-02-12 ENCOUNTER — Other Ambulatory Visit (INDEPENDENT_AMBULATORY_CARE_PROVIDER_SITE_OTHER): Payer: Self-pay | Admitting: Gastroenterology

## 2022-05-16 ENCOUNTER — Other Ambulatory Visit (INDEPENDENT_AMBULATORY_CARE_PROVIDER_SITE_OTHER): Payer: Self-pay | Admitting: Gastroenterology

## 2022-07-28 ENCOUNTER — Other Ambulatory Visit (INDEPENDENT_AMBULATORY_CARE_PROVIDER_SITE_OTHER): Payer: Self-pay | Admitting: Gastroenterology

## 2022-11-04 ENCOUNTER — Ambulatory Visit (HOSPITAL_COMMUNITY)
Admission: RE | Admit: 2022-11-04 | Discharge: 2022-11-04 | Disposition: A | Discharge status: Home / Self Care | Payer: Medicare (Managed Care) | Source: Ambulatory Visit | Attending: Family Medicine | Admitting: Family Medicine

## 2022-11-04 ENCOUNTER — Other Ambulatory Visit (HOSPITAL_COMMUNITY): Payer: Self-pay | Admitting: Family Medicine

## 2022-11-04 DIAGNOSIS — R059 Cough, unspecified: Secondary | ICD-10-CM | POA: Insufficient documentation

## 2022-11-04 DIAGNOSIS — Z1231 Encounter for screening mammogram for malignant neoplasm of breast: Secondary | ICD-10-CM

## 2022-11-04 DIAGNOSIS — R079 Chest pain, unspecified: Secondary | ICD-10-CM

## 2022-11-12 ENCOUNTER — Ambulatory Visit (HOSPITAL_COMMUNITY)
Admission: RE | Admit: 2022-11-12 | Discharge: 2022-11-12 | Disposition: A | Discharge status: Home / Self Care | Payer: Medicare (Managed Care) | Source: Ambulatory Visit | Attending: Family Medicine | Admitting: Family Medicine

## 2022-11-12 ENCOUNTER — Encounter (HOSPITAL_COMMUNITY): Payer: Self-pay

## 2022-11-12 DIAGNOSIS — Z1231 Encounter for screening mammogram for malignant neoplasm of breast: Secondary | ICD-10-CM | POA: Diagnosis present

## 2022-12-20 NOTE — Progress Notes (Signed)
Shelley Bautista, female    DOB: Sep 09, 1956    MRN: [id number]   Brief patient profile:  36  yobf  never smoker  referred to pulmonary clinic in [place]  12/21/2022 by Dr Phillips Odor  for chronic cough / never prior resp cc  onset  with uri before tgiving.   History of Present Illness  12/21/2022  Pulmonary/ 1st office eval/ Shelley Bautista / [place] [facility]  Chief Complaint  Patient presents with   Consult    Cough   Dyspnea:  not limited  Cough: dry hack worse when head hits pillow feels choking / no assoc nasal drainage  Sleep: very poorly level bed 1.5 pillows  SABA use: none     No obvious other patterns in  day to day or daytime  variability or assoc excess/ purulent sputum or mucus plugs or hemoptysis or cp or chest tightness, subjective wheeze or overt sinus or hb symptoms.   Sleeping  without nocturnal  or early am exacerbation  of respiratory  c/o's or need for noct saba. Also denies any obvious fluctuation of symptoms with weather or environmental changes or other aggravating or alleviating factors except as outlined above.  No unusual exposure hx or h/o childhood pna/ asthma or knowledge of premature birth.  Current Allergies, Complete Past Medical History, Past Surgical History, Family History, and Social History were reviewed in [employer] record.  ROS  The following are not active complaints unless bolded Hoarseness, sore throat, dysphagia, dental problems, itching, sneezing,  nasal congestion or discharge of excess mucus or purulent secretions, ear ache,   fever, chills, sweats, unintended wt loss or wt gain, classically pleuritic or exertional cp,  orthopnea pnd or arm/hand swelling  or leg swelling, presyncope, palpitations, abdominal pain, anorexia, nausea, vomiting, diarrhea  or change in bowel habits or change in bladder habits, change in stools or change in urine, dysuria, hematuria,  rash, arthralgias, visual complaints, headache, numbness, weakness or  ataxia or problems with walking or coordination,  change in mood or  memory.               Past Medical History:  Diagnosis Date   Atypical chest pain    Headache    Heart murmur    a. benign -  2D Echo 01/02/18 showed EF 55-60%, grade 1 DD, mild AI, trivial MR/TR, PASP , CVP 3.    High cholesterol    Hypertension    Post-tussive syncope     Outpatient Medications Prior to Visit  Medication Sig Dispense Refill   amLODipine (NORVASC) 5 MG tablet Take 1 tablet (5 mg total) by mouth daily. (Patient taking differently: Take 5 mg by mouth in the morning.) 90 tablet 3   nitroGLYCERIN (NITROSTAT) 0.4 MG SL tablet Place 1 tablet (0.4 mg total) under the tongue every 5 (five) minutes as needed for chest pain. 30 tablet 0   omeprazole (PRILOSEC) 20 MG capsule Take 1 capsule (20 mg total) by mouth daily. NEEDS OFFICE VISIT 90 capsule 0   zolpidem (AMBIEN) 10 MG tablet Take 10 mg by mouth at bedtime as needed (insomnia (due to 3rd shift)).     No facility-administered medications prior to visit.     Objective:     BP 130/66   Pulse 82   Ht 4\' 11"  (1.499 m)   Wt 159 lb 6.4 oz (72.3 kg)   SpO2 100% Comment: ra  BMI 32.19 kg/m   SpO2: 100 % (ra)  Pleasant amb bf  nad    HEENT : Oropharynx  slt erythema        NECK :  without  apparent JVD/ palpable Nodes/TM    LUNGS: no acc muscle use,  Nl contour chest which is clear to A and P bilaterally with harsh dry  cough on insp or exp maneuvers   CV:  RRR  no s3 or murmur or increase in P2, and no edema   ABD:  soft and nontender with nl inspiratory excursion in the supine position. No bruits or organomegaly appreciated   MS:  Nl gait/ ext warm without deformities Or obvious joint restrictions  calf tenderness, cyanosis or clubbing    SKIN: warm and dry without lesions    NEURO:  alert, approp, nl sensorium with  no motor or cerebellar deficits apparent.    I personally reviewed images and agree with radiology impression as  follows:  CXR:   pa and lateral 11/04/22 No active cardiopulmonary disease     Assessment   Upper airway cough syndrome Onset prior to Tgiving 2024 in setting of URI - cyclical cough rx 12/21/2022 >>>   Of the three most common causes of  Sub-acute / recurrent or chronic cough, only one (GERD)  can actually contribute to/ trigger  the other two (asthma and post nasal drip syndrome)  and perpetuate the cylce of cough.  While not intuitively obvious, many patients with chronic low grade reflux do not cough until there is a primary insult that disturbs the protective epithelial barrier and exposes sensitive nerve endings.   This is typically viral (as was the case here initially) but can due to PNDS and  either may apply here.     >>> The point is that once this occurs, it is difficult to eliminate the cycle  using anything but a maximally effective acid suppression regimen at least in the short run, accompanied by an appropriate diet to address non acid GERD and control  pnds with 1st gen H1 blockers per guidelines  / eliminate the cough itself for at least 3 days with tramadol >>> also so added 6 day taper off  Prednisone starting at 40 mg per day in case of component of Th-2 driven upper or lower airways inflammation (if cough responds short term only to relapse before return while will on full rx for uacs (as above), then  that would point to allergic rhinitis/ asthma or eos bronchitis as alternative dx)   F/u in 2 weeks if not all better           Each maintenance medication was reviewed in detail including emphasizing most importantly the difference between maintenance and prns and under what circumstances the prns are to be triggered using an action plan format where appropriate.  Total time for H and P, chart review, counseling,  and generating customized AVS unique to this office visit / same day charting   > 30 min for  refractory respiratory  symptoms of uncertain etiology            Sandrea Hughs, MD 12/21/2022

## 2022-12-21 ENCOUNTER — Encounter: Payer: Self-pay | Admitting: Internal Medicine

## 2022-12-21 ENCOUNTER — Ambulatory Visit (INDEPENDENT_AMBULATORY_CARE_PROVIDER_SITE_OTHER): Payer: Medicare (Managed Care) | Admitting: Internal Medicine

## 2022-12-21 VITALS — BP 130/66 | HR 82 | Ht 59.0 in | Wt 159.4 lb

## 2022-12-21 DIAGNOSIS — R058 Other specified cough: Secondary | ICD-10-CM

## 2022-12-21 MED ORDER — PREDNISONE 10 MG PO TABS: ORAL_TABLET | ORAL | 0 refills | Status: AC

## 2022-12-21 MED ORDER — PANTOPRAZOLE SODIUM 40 MG PO TBEC: 40.0000 mg | DELAYED_RELEASE_TABLET | Freq: Every day | ORAL | 2 refills | Status: DC

## 2022-12-21 MED ORDER — TRAMADOL HCL 50 MG PO TABS: 50.0000 mg | ORAL_TABLET | ORAL | 0 refills | Status: AC | PRN

## 2022-12-21 NOTE — Assessment & Plan Note (Addendum)
Onset prior to [place] 2024 in setting of URI - cyclical cough rx [id number] >>>   Of the three most common causes of  Sub-acute / recurrent or chronic cough, only one (GERD)  can actually contribute to/ trigger  the other two (asthma and post nasal drip syndrome)  and perpetuate the cylce of cough.  While not intuitively obvious, many patients with chronic low grade reflux do not cough until there is a primary insult that disturbs the protective epithelial barrier and exposes sensitive nerve endings.   This is typically viral (as was the case here initially) but can due to PNDS and  either may apply here.     >>> The point is that once this occurs, it is difficult to eliminate the cycle  using anything but a maximally effective acid suppression regimen at least in the short run, accompanied by an appropriate diet to address non acid GERD and control  pnds with 1st gen H1 blockers per guidelines  / eliminate the cough itself for at least 3 days with tramadol >>> also so added 6 day taper off  Prednisone starting at 40 mg per day in case of component of Th-2 driven upper or lower airways inflammation (if cough responds short term only to relapse before return while will on full rx for uacs (as above), then  that would point to allergic rhinitis/ asthma or eos bronchitis as alternative dx)   F/u in 2 weeks if not all better           Each maintenance medication was reviewed in detail including emphasizing most importantly the difference between maintenance and prns and under what circumstances the prns are to be triggered using an action plan format where appropriate.  Total time for H and P, chart review, counseling,  and generating customized AVS unique to this office visit / same day charting   > 30 min for  refractory respiratory  symptoms of uncertain etiology with pt new to me

## 2022-12-21 NOTE — Patient Instructions (Addendum)
The key to effective treatment for your cough is eliminating the non-stop cycle of cough you're stuck in long enough to let your airway heal completely and then see if there is anything still making you cough once you stop the cough suppression, but this should take no more than 5 days to figure out  First take delsym two tsp every 12 hours and supplement if needed with  tramadol 50 mg up to 2 every 4 hours to suppress the urge to cough at all or even clear your throat. Swallowing water or using ice chips/non mint and menthol containing candies (such as lifesavers or sugarless jolly ranchers) are also effective.  You should rest your voice and avoid activities that you know make you cough.  Once you have eliminated the cough for 3 straight days try reducing the tramadol first,  then the delsym as tolerated.    Prednisone 10 mg take  4 each am x 2 days,   2 each am x 2 days,  1 each am x 2 days and stop (this is to eliminate allergies and inflammation from coughing)  Protonix (pantoprazole) Take 30-60 min before first meal of the day and Pepcid 20 mg one bedtime plus chlorpheniramine 4 mg x 2 at bedtime (both available over the counter)  until cough is completely gone for at least a week without the need for cough suppression  GERD (REFLUX)  is an extremely common cause of respiratory symptoms, many times with no significant heartburn at all.    It can be treated with medication, but also with lifestyle changes including avoidance of late meals, excessive alcohol, smoking cessation, and avoid fatty foods, chocolate, peppermint, colas, red wine, and acidic juices such as orange juice.  NO MINT OR MENTHOL PRODUCTS SO NO COUGH DROPS  USE HARD CANDY INSTEAD (jolley ranchers or Stover's or Lifesavers (all available in sugarless versions) NO OIL BASED VITAMINS - use powdered substitutes.     If not all better in a week please call for follow up

## 2023-10-06 ENCOUNTER — Ambulatory Visit (HOSPITAL_COMMUNITY)
Admission: RE | Admit: 2023-10-06 | Discharge: 2023-10-06 | Disposition: A | Discharge status: Home / Self Care | Payer: Medicare (Managed Care) | Source: Ambulatory Visit | Attending: Internal Medicine | Admitting: Internal Medicine

## 2023-10-06 ENCOUNTER — Other Ambulatory Visit (HOSPITAL_COMMUNITY): Payer: Self-pay | Admitting: Internal Medicine

## 2023-10-06 DIAGNOSIS — M25552 Pain in left hip: Secondary | ICD-10-CM | POA: Diagnosis present

## 2023-11-02 ENCOUNTER — Encounter (HOSPITAL_COMMUNITY): Payer: Self-pay

## 2023-11-02 ENCOUNTER — Emergency Department (HOSPITAL_COMMUNITY): Payer: Medicare (Managed Care)

## 2023-11-02 ENCOUNTER — Other Ambulatory Visit: Payer: Self-pay

## 2023-11-02 ENCOUNTER — Observation Stay (HOSPITAL_COMMUNITY)
Admission: EM | Admit: 2023-11-02 | Discharge: 2023-11-03 | Disposition: A | Discharge status: Home / Self Care | Payer: Medicare (Managed Care) | Attending: Family Medicine | Admitting: Family Medicine

## 2023-11-02 DIAGNOSIS — R079 Chest pain, unspecified: Secondary | ICD-10-CM | POA: Diagnosis present

## 2023-11-02 DIAGNOSIS — R0789 Other chest pain: Principal | ICD-10-CM | POA: Insufficient documentation

## 2023-11-02 DIAGNOSIS — Z79899 Other long term (current) drug therapy: Secondary | ICD-10-CM | POA: Diagnosis not present

## 2023-11-02 DIAGNOSIS — I10 Essential (primary) hypertension: Secondary | ICD-10-CM | POA: Insufficient documentation

## 2023-11-02 DIAGNOSIS — E876 Hypokalemia: Secondary | ICD-10-CM | POA: Insufficient documentation

## 2023-11-02 DIAGNOSIS — Z7901 Long term (current) use of anticoagulants: Secondary | ICD-10-CM | POA: Insufficient documentation

## 2023-11-02 DIAGNOSIS — Z6832 Body mass index (BMI) 32.0-32.9, adult: Secondary | ICD-10-CM | POA: Diagnosis not present

## 2023-11-02 DIAGNOSIS — F1090 Alcohol use, unspecified, uncomplicated: Secondary | ICD-10-CM | POA: Diagnosis not present

## 2023-11-02 DIAGNOSIS — E66811 Obesity, class 1: Secondary | ICD-10-CM | POA: Insufficient documentation

## 2023-11-02 DIAGNOSIS — E669 Obesity, unspecified: Secondary | ICD-10-CM | POA: Insufficient documentation

## 2023-11-02 DIAGNOSIS — K219 Gastro-esophageal reflux disease without esophagitis: Secondary | ICD-10-CM | POA: Insufficient documentation

## 2023-11-02 DIAGNOSIS — R058 Other specified cough: Secondary | ICD-10-CM

## 2023-11-02 LAB — CBC
HCT: 43.5 % (ref 36.0–46.0)
Hemoglobin: 14.4 g/dL (ref 12.0–15.0)
MCH: 29.3 pg (ref 26.0–34.0)
MCHC: 33.1 g/dL (ref 30.0–36.0)
MCV: 88.6 fL (ref 80.0–100.0)
Platelets: 382 10*3/uL (ref 150–400)
RBC: 4.91 MIL/uL (ref 3.87–5.11)
RDW: 14.2 % (ref 11.5–15.5)
WBC: 6.6 10*3/uL (ref 4.0–10.5)
nRBC: 0 % (ref 0.0–0.2)

## 2023-11-02 LAB — TROPONIN I (HIGH SENSITIVITY): Troponin I (High Sensitivity): 3 ng/L (ref <18)

## 2023-11-02 LAB — BASIC METABOLIC PANEL
Anion gap: 12 (ref 5–15)
BUN: 12 mg/dL (ref 8–23)
CO2: 24 mmol/L (ref 22–32)
Calcium: 9.1 mg/dL (ref 8.9–10.3)
Chloride: 101 mmol/L (ref 98–111)
Creatinine, Ser: 0.77 mg/dL (ref 0.44–1.00)
GFR, Estimated: >60 mL/min (ref >60)
Glucose, Bld: 143 mg/dL — ABNORMAL HIGH (ref 70–99)
Potassium: 3.3 mmol/L — ABNORMAL LOW (ref 3.5–5.1)
Sodium: 137 mmol/L (ref 135–145)

## 2023-11-02 NOTE — ED Triage Notes (Signed)
 Pt to ED from home with c/o chest pain/pressure that started 2-3 days ago off and on. Pt also reports high BP.

## 2023-11-03 ENCOUNTER — Observation Stay (HOSPITAL_BASED_OUTPATIENT_CLINIC_OR_DEPARTMENT_OTHER): Payer: Medicare (Managed Care)

## 2023-11-03 ENCOUNTER — Telehealth: Payer: Self-pay

## 2023-11-03 DIAGNOSIS — R079 Chest pain, unspecified: Secondary | ICD-10-CM | POA: Diagnosis not present

## 2023-11-03 DIAGNOSIS — R0789 Other chest pain: Secondary | ICD-10-CM | POA: Diagnosis not present

## 2023-11-03 DIAGNOSIS — K219 Gastro-esophageal reflux disease without esophagitis: Secondary | ICD-10-CM | POA: Diagnosis not present

## 2023-11-03 DIAGNOSIS — E669 Obesity, unspecified: Secondary | ICD-10-CM

## 2023-11-03 DIAGNOSIS — I200 Unstable angina: Secondary | ICD-10-CM | POA: Diagnosis not present

## 2023-11-03 DIAGNOSIS — E876 Hypokalemia: Secondary | ICD-10-CM | POA: Diagnosis not present

## 2023-11-03 DIAGNOSIS — I10 Essential (primary) hypertension: Secondary | ICD-10-CM | POA: Diagnosis not present

## 2023-11-03 LAB — ECHOCARDIOGRAM COMPLETE
AR max vel: 1.95 cm2
AV Area VTI: 2.12 cm2
AV Area mean vel: 1.9 cm2
AV Mean grad: 7 mm[Hg]
AV Peak grad: 12.7 mm[Hg]
Ao pk vel: 1.78 m/s
Area-P 1/2: 3.12 cm2
Calc EF: 49.4 %
Est EF: 55
Height: 59 in
MV VTI: 3.04 cm2
P 1/2 time: 534 ms
S' Lateral: 1.9 cm
Single Plane A2C EF: 53.1 %
Single Plane A4C EF: 44.2 %
Weight: 2550.28 [oz_av]

## 2023-11-03 LAB — COMPREHENSIVE METABOLIC PANEL
ALT: 36 U/L (ref 0–44)
AST: 20 U/L (ref 15–41)
Albumin: 3.7 g/dL (ref 3.5–5.0)
Alkaline Phosphatase: 71 U/L (ref 38–126)
Anion gap: 8 (ref 5–15)
BUN: 10 mg/dL (ref 8–23)
CO2: 24 mmol/L (ref 22–32)
Calcium: 8.9 mg/dL (ref 8.9–10.3)
Chloride: 103 mmol/L (ref 98–111)
Creatinine, Ser: 0.6 mg/dL (ref 0.44–1.00)
GFR, Estimated: >60 mL/min (ref >60)
Glucose, Bld: 118 mg/dL — ABNORMAL HIGH (ref 70–99)
Potassium: 3.9 mmol/L (ref 3.5–5.1)
Sodium: 135 mmol/L (ref 135–145)
Total Bilirubin: 0.6 mg/dL (ref 0.0–1.2)
Total Protein: 7.4 g/dL (ref 6.5–8.1)

## 2023-11-03 LAB — MAGNESIUM: Magnesium: 2.5 mg/dL — ABNORMAL HIGH (ref 1.7–2.4)

## 2023-11-03 LAB — CBC
HCT: 43.8 % (ref 36.0–46.0)
Hemoglobin: 14.7 g/dL (ref 12.0–15.0)
MCH: 29.8 pg (ref 26.0–34.0)
MCHC: 33.6 g/dL (ref 30.0–36.0)
MCV: 88.8 fL (ref 80.0–100.0)
Platelets: 351 10*3/uL (ref 150–400)
RBC: 4.93 MIL/uL (ref 3.87–5.11)
RDW: 14.3 % (ref 11.5–15.5)
WBC: 5.6 10*3/uL (ref 4.0–10.5)
nRBC: 0 % (ref 0.0–0.2)

## 2023-11-03 LAB — HEMOGLOBIN A1C
Hgb A1c MFr Bld: 5.5 % (ref 4.8–5.6)
Mean Plasma Glucose: 111.15 mg/dL

## 2023-11-03 LAB — LIPID PANEL
Cholesterol: 230 mg/dL — ABNORMAL HIGH (ref 0–200)
HDL: 99 mg/dL (ref >40)
LDL Cholesterol: 107 mg/dL — ABNORMAL HIGH (ref 0–99)
Total CHOL/HDL Ratio: 2.3 {ratio}
Triglycerides: 119 mg/dL (ref <150)
VLDL: 24 mg/dL (ref 0–40)

## 2023-11-03 LAB — PHOSPHORUS: Phosphorus: 4 mg/dL (ref 2.5–4.6)

## 2023-11-03 LAB — TSH: TSH: 0.883 u[IU]/mL (ref 0.350–4.500)

## 2023-11-03 LAB — HIV ANTIBODY (ROUTINE TESTING W REFLEX): HIV Screen 4th Generation wRfx: NONREACTIVE

## 2023-11-03 LAB — TROPONIN I (HIGH SENSITIVITY): Troponin I (High Sensitivity): 3 ng/L (ref <18)

## 2023-11-03 MED ORDER — PANTOPRAZOLE SODIUM 40 MG PO TBEC: 40.0000 mg | DELAYED_RELEASE_TABLET | Freq: Every day | ORAL | 2 refills | Status: AC

## 2023-11-03 MED ORDER — ASPIRIN 81 MG PO TBEC: 81.0000 mg | DELAYED_RELEASE_TABLET | Freq: Every day | ORAL | 11 refills | Status: AC

## 2023-11-03 MED ORDER — NITROGLYCERIN 0.4 MG SL SUBL: 0.4000 mg | SUBLINGUAL_TABLET | SUBLINGUAL | Status: DC | PRN

## 2023-11-03 MED ORDER — PANTOPRAZOLE SODIUM 40 MG PO TBEC
40.0000 mg | DELAYED_RELEASE_TABLET | Freq: Every day | ORAL | Status: DC
  Administered 2023-11-03: 40 mg via ORAL
  Filled 2023-11-03: qty 1

## 2023-11-03 MED ORDER — ONDANSETRON HCL 4 MG/2ML IJ SOLN: 4.0000 mg | Freq: Four times a day (QID) | INTRAMUSCULAR | Status: DC | PRN

## 2023-11-03 MED ORDER — AMLODIPINE BESYLATE 10 MG PO TABS: 10.0000 mg | ORAL_TABLET | Freq: Every day | ORAL | 6 refills | Status: AC

## 2023-11-03 MED ORDER — ACETAMINOPHEN 325 MG PO TABS: 650.0000 mg | ORAL_TABLET | Freq: Four times a day (QID) | ORAL | Status: DC | PRN

## 2023-11-03 MED ORDER — POTASSIUM CHLORIDE CRYS ER 20 MEQ PO TBCR
40.0000 meq | EXTENDED_RELEASE_TABLET | Freq: Once | ORAL | Status: AC
  Administered 2023-11-03: 40 meq via ORAL
  Filled 2023-11-03: qty 2

## 2023-11-03 MED ORDER — AMLODIPINE BESYLATE 5 MG PO TABS
10.0000 mg | ORAL_TABLET | Freq: Every day | ORAL | Status: DC
  Administered 2023-11-03: 10 mg via ORAL
  Filled 2023-11-03: qty 2

## 2023-11-03 MED ORDER — ASPIRIN 81 MG PO TBEC
81.0000 mg | DELAYED_RELEASE_TABLET | Freq: Every day | ORAL | Status: DC
  Administered 2023-11-03: 81 mg via ORAL
  Filled 2023-11-03: qty 1

## 2023-11-03 MED ORDER — ACETAMINOPHEN 650 MG RE SUPP: 650.0000 mg | Freq: Four times a day (QID) | RECTAL | Status: DC | PRN

## 2023-11-03 MED ORDER — ENOXAPARIN SODIUM 40 MG/0.4ML IJ SOSY
40.0000 mg | PREFILLED_SYRINGE | INTRAMUSCULAR | Status: DC
  Filled 2023-11-03: qty 0.4

## 2023-11-03 MED ORDER — ONDANSETRON HCL 4 MG PO TABS: 4.0000 mg | ORAL_TABLET | Freq: Four times a day (QID) | ORAL | Status: DC | PRN

## 2023-11-03 MED ORDER — METOPROLOL TARTRATE 100 MG PO TABS: 100.0000 mg | ORAL_TABLET | ORAL | 0 refills | Status: DC

## 2023-11-03 MED ORDER — ASPIRIN 325 MG PO TABS
325.0000 mg | ORAL_TABLET | Freq: Once | ORAL | Status: AC
  Administered 2023-11-03: 325 mg via ORAL
  Filled 2023-11-03: qty 1

## 2023-11-03 MED ORDER — AMLODIPINE BESYLATE 5 MG PO TABS: 5.0000 mg | ORAL_TABLET | Freq: Every day | ORAL | Status: DC

## 2023-11-03 NOTE — TOC CM/SW Note (Signed)
 Transition of Care [facility]) - Inpatient Brief Assessment   Patient Details  Name: Shelley Bautista MRN: [id number] Date of Birth: 1956-07-02  [facility] CM/SW Contact:    Lucie Lunger, LCSWA Phone Number: 11/03/2023, 11:48 AM   Clinical Narrative: Tranisition of Care Department [facility]) has reviewed patient and no TOC needs have been ifentified at this time. We will continue to monitor patient advancement through interdisciplinary progression rounds. If new patient transition needs arise, please place a TOC consult.   Transition of Care Asessment: Insurance and Status: Insurance coverage has been reviewed Patient has primary care physician: Yes Home environment has been reviewed: From home Prior level of function:: Independent Prior/Current Home Services: No current home services Social Drivers of Health Review: SDOH reviewed no interventions necessary Readmission risk has been reviewed: Yes Transition of care needs: no transition of care needs at this time

## 2023-11-03 NOTE — ED Provider Notes (Signed)
 [facility]ENCY DEPT [facility] Emergency Department Provider Note MRN:  [id number]  Arrival date & time: 11/03/23     Chief Complaint   Chest Pain   History of Present Illness   Shelley Bautista is a 68 y.o. year-old female with a history of hypertension presenting to the ED with chief complaint of chest pain.  Intermittent chest pressure for the past 3 or 4 days, associated with nausea on occasion, occasional paresthesia to the fingers, denies shortness of breath, no leg pain or swelling.  Review of Systems  A thorough review of systems was obtained and all systems are negative except as noted in the HPI and PMH.   Patient's Health History    Past Medical History:  Diagnosis Date   Atypical chest pain    Headache    Heart murmur    a. benign -  2D Echo 01/02/18 showed EF 55-60%, grade 1 DD, mild AI, trivial MR/TR, PASP , CVP 3.    High cholesterol    Hypertension    Post-tussive syncope     Past Surgical History:  Procedure Laterality Date   BIOPSY  05/22/2021   Procedure: BIOPSY;  Surgeon: Eartha Angelia Sieving, MD;  Location: [facility];  Service: Gastroenterology;;   COLONOSCOPY WITH PROPOFOL  N/A 05/22/2021   Procedure: COLONOSCOPY WITH PROPOFOL ;  Surgeon: Eartha Angelia Sieving, MD;  Location: [facility];  Service: Gastroenterology;  Laterality: N/A;  8:15   CYSTECTOMY     Ovary   ESOPHAGOGASTRODUODENOSCOPY (EGD) WITH PROPOFOL  N/A 05/22/2021   Procedure: ESOPHAGOGASTRODUODENOSCOPY (EGD) WITH PROPOFOL ;  Surgeon: Eartha Angelia Sieving, MD;  Location: [facility];  Service: Gastroenterology;  Laterality: N/A;   POLYPECTOMY  05/22/2021   Procedure: POLYPECTOMY;  Surgeon: Eartha Angelia, Sieving, MD;  Location: [facility];  Service: Gastroenterology;;    Family History  Problem Relation Age of Onset   Cancer Mother    Other Father        blood clot    Social History   Socioeconomic History   Marital status: Married    Spouse name: Meribeth    Number of children: 4   Years of education: GED   Highest education level: Not on file  Occupational History    Employer: [employer]    Comment: Horticulturist, Commercial  Tobacco Use   Smoking status: Never   Smokeless tobacco: Never  Vaping Use   Vaping status: Never Used  Substance and Sexual Activity   Alcohol use: Yes    Comment: one beer to help sleep about 3 days a week   Drug use: No   Sexual activity: Not on file  Other Topics Concern   Not on file  Social History Narrative   Patient lives at home with her family.   Caffeine use: 2 drinks daily   Social Drivers of Corporate Investment Banker Strain: Not on file  Food Insecurity: Not on file  Transportation Needs: Not on file  Physical Activity: Not on file  Stress: Not on file  Social Connections: Not on file  Intimate Partner Violence: Not At Risk (07/18/2023)   Received from [facility]   Humiliation, Afraid, Rape, and Kick questionnaire    Fear of Current or Ex-Partner: No    Emotionally Abused: No    Physically Abused: No    Sexually Abused: No     Physical Exam   Vitals:   11/02/23 2148  BP: (!) 163/88  Pulse: 94  Resp: 16  Temp: 100.3  F (37.9 C)  SpO2: 100%    CONSTITUTIONAL: Well-appearing, NAD NEURO/PSYCH:  Alert and oriented x 3, no focal deficits EYES:  eyes equal and reactive ENT/NECK:  no LAD, no JVD CARDIO: Regular rate, well-perfused, normal S1 and S2 PULM:  CTAB no wheezing or rhonchi GI/GU:  non-distended, non-tender MSK/SPINE:  No gross deformities, no edema SKIN:  no rash, atraumatic   *Additional and/or pertinent findings included in MDM below  Diagnostic and Interventional Summary    EKG Interpretation Date/Time:  Wednesday November 02 2023 21:50:27 EST Ventricular Rate:  88 PR Interval:  150 QRS Duration:  88 QT Interval:  374 QTC Calculation: 452 R Axis:   6  Text Interpretation: Normal sinus rhythm Minimal voltage criteria for LVH, may be normal variant (  Cornell product ) Nonspecific T wave abnormality Abnormal ECG When compared with ECG of 23-Nov-2018 18:32, PREVIOUS ECG IS PRESENT Confirmed by Theadore Sharper [phone]) on 11/02/2023 11:03:13 PM       Labs Reviewed  BASIC METABOLIC PANEL - Abnormal; Notable for the following components:      Result Value   Potassium 3.3 (*)    Glucose, Bld 143 (*)    All other components within normal limits  CBC  TROPONIN I (HIGH SENSITIVITY)  TROPONIN I (HIGH SENSITIVITY)    DG Chest 2 View  Final Result      Medications  potassium chloride  SA (KLOR-CON  M) CR tablet 40 mEq (has no administration in time range)     Procedures  /  Critical Care Procedures  ED Course and Medical Decision Making  Initial Impression and Ddx Patient has past medical history significant for TIA, hypertension, she has never had ischemic evaluation of her heart coming in with chest pressure, somewhat suspicious for cardiac related pain.  EKG with some nonspecific changes.  She has no cardiologist.  ACS is considered, overall doubt PE.  Past medical/surgical history that increases complexity of ED encounter: TIA, hypertension  Interpretation of Diagnostics I personally reviewed the EKG and my interpretation is as follows: Sinus rhythm, nonspecific ST segment findings  Troponin negative, labs overall reassuring with no significant blood count or electrolyte disturbance, mild hypokalemia  Patient Reassessment and Ultimate Disposition/Management     Heart score of 5, will request hospitalist admission.  Patient management required discussion with the following services or consulting groups:  Hospitalist Service  Complexity of Problems Addressed Acute illness or injury that poses threat of life of bodily function  Additional Data Reviewed and Analyzed Further history obtained from: Prior labs/imaging results  Additional Factors Impacting ED Encounter Risk Consideration of hospitalization  Sharper HERO. Theadore,  MD [facility] Emergency Medicine [facility] [email or web address]  Final Clinical Impressions(s) / ED Diagnoses     ICD-10-CM   1. Chest pain, unspecified type  R07.9       ED Discharge Orders     None        Discharge Instructions Discussed with and Provided to Patient:   Discharge Instructions   None      Theadore Sharper HERO, MD 11/03/23 0002

## 2023-11-03 NOTE — H&P (Signed)
 History and Physical    Patient: Shelley Bautista FMW:[id number] DOB: 11/01/1955 DOA: 11/02/2023 DOS: the patient was seen and examined on 11/03/2023 PCP: Marvine Rush, MD  Patient coming from: Home  Chief Complaint:  Chief Complaint  Patient presents with   Chest Pain   HPI: Shelley Bautista is a 68 y.o. female with medical history significant of hypertension, GERD who presents to the emergency department due to 4-day onset of intermittent midsternal nonradiating chest pain described as pressure which was associated with nausea and paresthesia to the fingers occasionally.  Today, the chest pressure was constant and was rated as 7/10 on pain scale.  This was nonreproducible and was not associated with activity.  Chest pressure occurred while at rest.  She denied family history of CHF  ED Course:  In the emergency department, temperature was 100.3 F, BP was 163/88, but other vital signs were within normal range.  Workup in the ED showed normal CBC and BMP except for potassium of 3.3, troponin x 2 was flat at 3. Chest x-ray showed no active cardiopulmonary disease Potassium was replenished.  Hospitalist was asked to admit patient for further evaluation and management.   Review of Systems: Review of systems as noted in the HPI. All other systems reviewed and are negative.   Past Medical History:  Diagnosis Date   Atypical chest pain    Headache    Heart murmur    a. benign -  2D Echo 01/02/18 showed EF 55-60%, grade 1 DD, mild AI, trivial MR/TR, PASP , CVP 3.    High cholesterol    Hypertension    Post-tussive syncope    Past Surgical History:  Procedure Laterality Date   BIOPSY  05/22/2021   Procedure: BIOPSY;  Surgeon: Eartha Angelia Sieving, MD;  [facility];  Service: Gastroenterology;;   COLONOSCOPY WITH PROPOFOL  N/A 05/22/2021   Procedure: COLONOSCOPY WITH PROPOFOL ;  Surgeon: Eartha Angelia Sieving, MD;  [facility];  Service: Gastroenterology;   Laterality: N/A;  8:15   CYSTECTOMY     Ovary   ESOPHAGOGASTRODUODENOSCOPY (EGD) WITH PROPOFOL  N/A 05/22/2021   Procedure: ESOPHAGOGASTRODUODENOSCOPY (EGD) WITH PROPOFOL ;  Surgeon: Eartha Angelia Sieving, MD;  [facility];  Service: Gastroenterology;  Laterality: N/A;   POLYPECTOMY  05/22/2021   Procedure: POLYPECTOMY;  Surgeon: Eartha Angelia Sieving, MD;  Location[facility];  Service: Gastroenterology;;    Social History:  reports that she has never smoked. She has never used smokeless tobacco. She reports current alcohol use. She reports that she does not use drugs.   No Known Allergies  Family History  Problem Relation Age of Onset   Cancer Mother    Other Father        blood clot     Prior to Admission medications   Medication Sig Start Date End Date Taking? Authorizing Provider  amLODipine  (NORVASC ) 5 MG tablet Take 1 tablet (5 mg total) by mouth daily. Patient taking differently: Take 5 mg by mouth in the morning. 02/10/18 05/20/23  Dunn, Dayna N, PA-C  nitroGLYCERIN  (NITROSTAT ) 0.4 MG SL tablet Place 1 tablet (0.4 mg total) under the tongue every 5 (five) minutes as needed for chest pain. 01/03/18   Knapp, Iva, MD  pantoprazole  (PROTONIX ) 40 MG tablet Take 1 tablet (40 mg total) by mouth daily. Take 30-60 min before first meal of the day 12/21/22   Darlean Ozell NOVAK, MD  predniSONE  (DELTASONE ) 10 MG tablet Take  4 each am x 2  days,   2 each am x 2 days,  1 each am x 2 days and stop 12/21/22   Darlean Ozell NOVAK, MD  zolpidem (AMBIEN) 10 MG tablet Take 10 mg by mouth at bedtime as needed (insomnia (due to 3rd shift)).    [provider]    Physical Exam: BP (!) 155/82   Pulse 78   Temp 100.3 F (37.9 C) (Oral)   Resp (!) 22   Ht 4' 11 (1.499 m)   Wt 72.3 kg   SpO2 97%   BMI 32.19 kg/m   General: 68 y.o. year-old female well developed well nourished in no acute distress.  Alert and oriented x3. HEENT: NCAT, EOMI Neck: Supple, trachea  medial Cardiovascular: Regular rate and rhythm with no rubs or gallops.  No thyromegaly or JVD noted.  No lower extremity edema. 2/4 pulses in all 4 extremities. Respiratory: Clear to auscultation with no wheezes or rales. Good inspiratory effort. Abdomen: Soft, nontender nondistended with normal bowel sounds x4 quadrants. Muskuloskeletal: No cyanosis, clubbing or edema noted bilaterally Neuro: CN II-XII intact, strength 5/5 x 4, sensation, reflexes intact Skin: No ulcerative lesions noted or rashes Psychiatry: Judgement and insight appear normal. Mood is appropriate for condition and setting          Labs on Admission:  Basic Metabolic Panel: Recent Labs  Lab 11/02/23 2200  NA 137  K 3.3*  CL 101  CO2 24  GLUCOSE 143*  BUN 12  CREATININE 0.77  CALCIUM 9.1   Liver Function Tests: No results for input(s): AST, ALT, ALKPHOS, BILITOT, PROT, ALBUMIN in the last 168 hours. No results for input(s): LIPASE, AMYLASE in the last 168 hours. No results for input(s): AMMONIA in the last 168 hours. CBC: Recent Labs  Lab 11/02/23 2200  WBC 6.6  HGB 14.4  HCT 43.5  MCV 88.6  PLT 382   Cardiac Enzymes: No results for input(s): CKTOTAL, CKMB, CKMBINDEX, TROPONINI in the last 168 hours.  BNP (last 3 results) No results for input(s): BNP in the last 8760 hours.  ProBNP (last 3 results) No results for input(s): PROBNP in the last 8760 hours.  CBG: No results for input(s): GLUCAP in the last 168 hours.  Radiological Exams on Admission: DG Chest 2 View Result Date: 11/02/2023 CLINICAL DATA:  Chest pain. Mid chest pain and discomfort. Shortness of breath, nausea, bilateral hand numbness. Shooting pains. EXAM: CHEST - 2 VIEW COMPARISON:  11/04/2022 FINDINGS: Normal heart size and pulmonary vascularity. No focal airspace disease or consolidation in the lungs. No blunting of costophrenic angles. No pneumothorax. Mediastinal contours appear intact.  Degenerative changes in the spine and shoulders. IMPRESSION: No active cardiopulmonary disease. Electronically Signed   By: Elsie Gravely M.D.   On: 11/02/2023 22:20    EKG: I independently viewed the EKG done and my findings are as followed: Normal sinus rhythm at a rate of 88 bpm  Assessment/Plan Present on Admission:  Chest pain  Essential hypertension  Principal Problem:   Chest pain Active Problems:   Essential hypertension   Hypokalemia   GERD (gastroesophageal reflux disease)   Obesity (BMI 30-39.9)  Chest pain Heart score = 4 Continue telemetry  Troponins x2 - 3 EKG showed normal sinus rhythm at rate of 88 bpm Aspirin  325 mg was given Cardiology will be consulted to help decide if Stress test is needed in am Versus other  diagnostic modalities.    Give aspirin , nitroglycerin  prn  Hypokalemia K+ 3.3, this was replenished  Essential hypertension Continue amlodipine   GERD Continue Protonix   Obesity (BMI 32.19) Diet and lifestyle modification   DVT prophylaxis: Lovenox   Code Status: Full code  Family Communication: None at bedside  Consults: Cardiology  Severity of Illness: The appropriate patient status for this patient is OBSERVATION. Observation status is judged to be reasonable and necessary in order to provide the required intensity of service to ensure the patient's safety. The patient's presenting symptoms, physical exam findings, and initial radiographic and laboratory data in the context of their medical condition is felt to place them at decreased risk for further clinical deterioration. Furthermore, it is anticipated that the patient will be medically stable for discharge from the hospital within 2 midnights of admission.   Author: Zaiah Eckerson, DO 11/03/2023 3:24 AM  For on call review [email or web address].

## 2023-11-03 NOTE — Discharge Summary (Signed)
 Shelley Bautista, is a 68 y.o. female  DOB Mar 03, 1956  MRN [id number].  Admission date:  11/02/2023  Admitting Physician  Posey Maier, DO  Discharge Date:  11/03/2023   Primary MD  Marvine Rush, MD  Recommendations for primary care physician for things to follow:   1)The Cardiology Team will reach out to you to schedule your CT coronary test--- to look for blockages and plaque in the arteries in your heart - 2) please note that there were some changes to your medications  Admission Diagnosis  Chest pain [R07.9]   Discharge Diagnosis  Chest pain [R07.9]    Principal Problem:   Chest pain Active Problems:   Essential hypertension   Hypokalemia   GERD (gastroesophageal reflux disease)   Obesity (BMI 30-39.9)      Past Medical History:  Diagnosis Date   Atypical chest pain    Headache    Heart murmur    a. benign -  2D Echo 01/02/18 showed EF 55-60%, grade 1 DD, mild AI, trivial MR/TR, PASP , CVP 3.    High cholesterol    Hypertension    Post-tussive syncope     Past Surgical History:  Procedure Laterality Date   BIOPSY  05/22/2021   Procedure: BIOPSY;  Surgeon: Eartha Angelia Sieving, MD;  Location: [facility];  Service: Gastroenterology;;   COLONOSCOPY WITH PROPOFOL  N/A 05/22/2021   Procedure: COLONOSCOPY WITH PROPOFOL ;  Surgeon: Eartha Angelia Sieving, MD;  Location: [facility];  Service: Gastroenterology;  Laterality: N/A;  8:15   CYSTECTOMY     [place]   ESOPHAGOGASTRODUODENOSCOPY (EGD) WITH PROPOFOL  N/A 05/22/2021   Procedure: ESOPHAGOGASTRODUODENOSCOPY (EGD) WITH PROPOFOL ;  Surgeon: Eartha Angelia Sieving, MD;  Location: [facility];  Service: Gastroenterology;  Laterality: N/A;   POLYPECTOMY  05/22/2021   Procedure: POLYPECTOMY;  Surgeon: Eartha Angelia, Sieving, MD;  Location: [facility];  Service: Gastroenterology;;     HPI  from the history and physical done  on the day of admission:   HPI: Shelley Bautista is a 68 y.o. female with medical history significant of hypertension, GERD who presents to the emergency department due to 4-day onset of intermittent midsternal nonradiating chest pain described as pressure which was associated with nausea and paresthesia to the fingers occasionally.  Today, the chest pressure was constant and was rated as 7/10 on pain scale.  This was nonreproducible and was not associated with activity.  Chest pressure occurred while at rest.  She denied family history of CHF   ED Course:  In the emergency department, temperature was 100.3 F, BP was 163/88, but other vital signs were within normal range.  Workup in the ED showed normal CBC and BMP except for potassium of 3.3, troponin x 2 was flat at 3. Chest x-ray showed no active cardiopulmonary disease Potassium was replenished.  Hospitalist was asked to admit patient for further evaluation and management.     Review of Systems: Review of systems as noted in the HPI. All other systems reviewed and are negative.  Hospital Course:   Atypical Chest pain --- Ruled out for ACS by EKG and cardiac enzymes -Cardiology consult appreciated -Echo with preserved EF of 55%, moderate LVH, no aortic stenosis -Cardiology team recommends outpatient CT coronary study--they will set this up for patient   Hypokalemia -Replaced and normalized   Essential hypertension Increase amlodipine  to 10 mg   GERD Continue Protonix    Class I obesity (BMI 32.19) Diet and lifestyle modification  Discharge Condition: Stable  Follow UP   Follow-up Information     Johnson Laymon HERO, PA-C. Schedule an appointment as soon as possible for a visit in 1 month(s).   Specialties: Cardiology, Cardiology Contact information: [address] [place] [address] [phone]                 Consults obtained -cardiology  Diet and Activity recommendation:  As advised  Discharge  Instructions    Discharge Instructions     Call MD for:  difficulty breathing, headache or visual disturbances   Complete by: As directed    Call MD for:  persistant dizziness or light-headedness   Complete by: As directed    Call MD for:  persistant nausea and vomiting   Complete by: As directed    Call MD for:  temperature >100.4   Complete by: As directed    Diet - low sodium heart healthy   Complete by: As directed    Discharge instructions   Complete by: As directed    1)The Cardiology Team will reach out to you to schedule your CT coronary test--- to look for blockages and plaque in the arteries in your heart - 2) please note that there were some changes to your medications   Increase activity slowly   Complete by: As directed        Discharge Medications     Allergies as of 11/03/2023   No Known Allergies      Medication List     TAKE these medications    amLODipine  10 MG tablet Commonly known as: NORVASC  Take 1 tablet (10 mg total) by mouth daily. Start taking on: November 04, 2023 What changed:  medication strength how much to take   aspirin  EC 81 MG tablet Take 1 tablet (81 mg total) by mouth daily with breakfast. Swallow whole.   nitroGLYCERIN  0.4 MG SL tablet Commonly known as: NITROSTAT  Place 1 tablet (0.4 mg total) under the tongue every 5 (five) minutes as needed for chest pain.   pantoprazole  40 MG tablet Commonly known as: Protonix  Take 1 tablet (40 mg total) by mouth daily. What changed: additional instructions   predniSONE  10 MG tablet Commonly known as: DELTASONE  Take  4 each am x 2 days,   2 each am x 2 days,  1 each am x 2 days and stop   zolpidem 10 MG tablet Commonly known as: AMBIEN Take 10 mg by mouth at bedtime as needed (insomnia (due to 3rd shift)).       Major procedures and Radiology Reports - PLEASE review detailed and final reports for all details, in brief -  ECHOCARDIOGRAM COMPLETE Result Date: 11/03/2023     ECHOCARDIOGRAM REPORT   Patient Name:   Shelley Bautista Date of Exam: 11/03/2023 Medical Rec #:  [id number]       Height:       59.0 in Accession #:    [id number]      Weight:       159.4 lb Date of Birth:  06-02-1956        BSA:          1.675 m Patient Age:    67 years        BP:           140/79 mmHg Patient Gender: F               HR:           70 bpm. Exam Location:  [facility] Procedure: 2D Echo, Cardiac Doppler and Color Doppler Indications:    Chest Pain  History:        Patient has prior history of Echocardiogram examinations, most                 recent 01/02/2018. Signs/Symptoms:Murmur; Risk                 Factors:Hypertension.  Sonographer:    Amy Chionchio Referring Phys: [id number] LAYMON CHRISTELLA QUA IMPRESSIONS  1. Left ventricular ejection fraction, by estimation, is approximately 55%. The left ventricle has normal function. The left ventricle has no regional wall motion abnormalities. There is moderate asymmetric left ventricular hypertrophy of the basal segment. Left ventricular diastolic parameters are consistent with Grade I diastolic dysfunction (impaired relaxation).  2. Right ventricular systolic function is normal. The right ventricular size is normal. There is normal pulmonary artery systolic pressure. The estimated right ventricular systolic pressure is 22.2 mmHg.  3. The mitral valve is degenerative. Trivial mitral valve regurgitation.  4. The aortic valve is tricuspid. Aortic valve regurgitation is mild. Aortic valve sclerosis is present, with no evidence of aortic valve stenosis. Aortic regurgitation PHT measures 534 msec. Aortic valve mean gradient measures 7.0 mmHg.  5. The inferior vena cava is normal in size with greater than 50% respiratory variability, suggesting right atrial pressure of 3 mmHg. Comparison(s): Prior images unable to be directly viewed. FINDINGS  Left Ventricle: Left ventricular ejection fraction, by estimation, is 55%. The left ventricle has normal function. The left  ventricle has no regional wall motion abnormalities. The left ventricular internal cavity size was normal in size. There is moderate asymmetric left ventricular hypertrophy of the basal segment. Left ventricular diastolic parameters are consistent with Grade I diastolic dysfunction (impaired relaxation). Right Ventricle: The right ventricular size is normal. No increase in right ventricular wall thickness. Right ventricular systolic function is normal. There is normal pulmonary artery systolic pressure. The tricuspid regurgitant velocity is 2.19 m/s, and  with an assumed right atrial pressure of 3 mmHg, the estimated right ventricular systolic pressure is 22.2 mmHg. Left Atrium: Left atrial size was normal in size. Right Atrium: Right atrial size was normal in size. Pericardium: There is no evidence of pericardial effusion. Mitral Valve: The mitral valve is degenerative in appearance. There is mild calcification of the mitral valve leaflet(s). Mild to moderate mitral annular calcification. Trivial mitral valve regurgitation. MV peak gradient, 3.7 mmHg. The mean mitral valve  gradient is 1.0 mmHg. Tricuspid Valve: The tricuspid valve is grossly normal. Tricuspid valve regurgitation is trivial. Aortic Valve: The aortic valve is tricuspid. There is mild to moderate aortic valve annular calcification. Aortic valve regurgitation is mild. Aortic regurgitation PHT measures 534 msec. Aortic valve sclerosis is present, with no evidence of aortic valve  stenosis. Aortic valve mean gradient measures 7.0 mmHg. Aortic valve peak gradient measures 12.7 mmHg. Aortic valve area, by VTI measures 2.12 cm. Pulmonic Valve: The pulmonic valve was grossly normal. Pulmonic valve regurgitation is trivial. Aorta: The aortic root  and ascending aorta are structurally normal, with no evidence of dilitation. Venous: The inferior vena cava is normal in size with greater than 50% respiratory variability, suggesting right atrial pressure of 3  mmHg. IAS/Shunts: No atrial level shunt detected by color flow Doppler.  LEFT VENTRICLE PLAX 2D LVIDd:         2.60 cm      Diastology LVIDs:         1.90 cm      LV e' medial:    3.15 cm/s LV PW:         1.00 cm      LV E/e' medial:  16.8 LV IVS:        1.50 cm      LV e' lateral:   6.42 cm/s LVOT diam:     2.10 cm      LV E/e' lateral: 8.2 LV SV:         68 LV SV Index:   40 LVOT Area:     3.46 cm  LV Volumes (MOD) LV vol d, MOD A2C: 87.9 ml LV vol d, MOD A4C: 106.0 ml LV vol s, MOD A2C: 41.2 ml LV vol s, MOD A4C: 59.1 ml LV SV MOD A2C:     46.7 ml LV SV MOD A4C:     106.0 ml LV SV MOD BP:      48.6 ml RIGHT VENTRICLE            IVC RV Basal diam:  2.60 cm    IVC diam: 1.40 cm RV S prime:     7.40 cm/s TAPSE (M-mode): 1.4 cm LEFT ATRIUM             Index        RIGHT ATRIUM           Index LA Vol (A2C):   37.0 ml 22.09 ml/m  RA Area:     10.60 cm LA Vol (A4C):   44.9 ml 26.81 ml/m  RA Volume:   22.00 ml  13.14 ml/m LA Biplane Vol: 42.0 ml 25.08 ml/m  AORTIC VALVE                     PULMONIC VALVE AV Area (Vmax):    1.95 cm      PV Vmax:          0.76 m/s AV Area (Vmean):   1.90 cm      PV Peak grad:     2.3 mmHg AV Area (VTI):     2.12 cm      PR End Diast Vel: 4.49 msec AV Vmax:           178.00 cm/s AV Vmean:          126.000 cm/s AV VTI:            0.319 m AV Peak Grad:      12.7 mmHg AV Mean Grad:      7.0 mmHg LVOT Vmax:         100.00 cm/s LVOT Vmean:        69.100 cm/s LVOT VTI:          0.195 m LVOT/AV VTI ratio: 0.61 AI PHT:            534 msec  AORTA Ao Root diam: 2.70 cm Ao Asc diam:  3.50 cm MITRAL VALVE                TRICUSPID VALVE  MV Area (PHT): 3.12 cm     TR Peak grad:   19.2 mmHg MV Area VTI:   3.04 cm     TR Vmax:        219.00 cm/s MV Peak grad:  3.7 mmHg MV Mean grad:  1.0 mmHg     SHUNTS MV Vmax:       0.96 m/s     Systemic VTI:  0.20 m MV Vmean:      52.0 cm/s    Systemic Diam: 2.10 cm MV Decel Time: 243 msec MV E velocity: 52.90 cm/s MV A velocity: 105.00 cm/s MV E/A ratio:   0.50 Jayson Sierras MD Electronically signed by Jayson Sierras MD Signature Date/Time: 11/03/2023/1:42:04 PM    Final    DG Chest 2 View Result Date: 11/02/2023 CLINICAL DATA:  Chest pain. Mid chest pain and discomfort. Shortness of breath, nausea, bilateral hand numbness. Shooting pains. EXAM: CHEST - 2 VIEW COMPARISON:  11/04/2022 FINDINGS: Normal heart size and pulmonary vascularity. No focal airspace disease or consolidation in the lungs. No blunting of costophrenic angles. No pneumothorax. Mediastinal contours appear intact. Degenerative changes in the spine and shoulders. IMPRESSION: No active cardiopulmonary disease. Electronically Signed   By: Elsie Gravely M.D.   On: 11/02/2023 22:20   DG HIP UNILAT WITH PELVIS 2-3 VIEWS LEFT Result Date: 10/10/2023 CLINICAL DATA:  Left hip pain and groin area Found on lateral side.  2 months.  No known injury. EXAM: DG HIP (WITH OR WITHOUT PELVIS) 2-3V LEFT COMPARISON:  None Available. FINDINGS: There is diffuse decreased bone mineralization. Moderate to severe superior right femoroacetabular joint space narrowing with high-grade subchondral sclerosis and cystic change. Mild-to-moderate superolateral right acetabular and right femoral head-neck junction degenerative osteophytosis. Mild superomedial left femoroacetabular joint space narrowing. Mild superolateral left acetabular osteophytosis. Mild degenerative irregularity of the left femoral head. Mild bilateral sacroiliac subchondral sclerosis. No acute fracture or dislocation. Vascular phleboliths overlie the pelvis. 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 Electronically Signed   By: Tanda Lyons M.D.   On: 10/10/2023 23:47   Today   Subjective    Mckinsey Viereck today has no new complaints -Patient's son is at bedside, questions answered -No further chest pains -No dizziness , no palpitations, no leg pains no leg swelling no dyspnea on exertion no  pleuritic symptoms          Patient has been seen and examined prior to discharge   Objective   Blood pressure 118/83, pulse 80, temperature 98.7 F (37.1 C), temperature source Oral, resp. rate 18, height 4' 11 (1.499 m), weight 72.3 kg, SpO2 97%.  Exam Gen:- Awake Alert, no acute distress  HEENT:- [place].AT, No sclera icterus Neck-Supple Neck,No JVD,.  Lungs-  CTAB , good air movement bilaterally CV- S1, S2 normal, regular Abd-  +ve B.Sounds, Abd Soft, No tenderness,    Extremity/Skin:- No  edema,   good pulses Psych-affect is appropriate, oriented x3 Neuro-no new focal deficits, no tremors    Data Review   CBC w Diff:  Lab Results  Component Value Date   WBC 5.6 11/03/2023   HGB 14.7 11/03/2023   HCT 43.8 11/03/2023   PLT 351 11/03/2023   LYMPHOPCT 27 01/03/2018   MONOPCT 6.6 04/15/2021   EOSPCT 1.8 04/15/2021   BASOPCT 0.7 04/15/2021   CMP:  Lab Results  Component Value Date   NA 135 11/03/2023   K 3.9 11/03/2023   CL 103 11/03/2023   CO2  24 11/03/2023   BUN 10 11/03/2023   CREATININE 0.60 11/03/2023   PROT 7.4 11/03/2023   ALBUMIN 3.7 11/03/2023   BILITOT 0.6 11/03/2023   ALKPHOS 71 11/03/2023   AST 20 11/03/2023   ALT 36 11/03/2023   Total Discharge time is about 33 minutes  Rendall Carwin M.D on 11/03/2023 at 2:28 PM  Go to [email or web address] -  for contact info  [facility] - Office  [phone]

## 2023-11-03 NOTE — Discharge Instructions (Signed)
 1)The Cardiology Team will reach out to you to schedule your CT coronary test--- to look for blockages and plaque in the arteries in your heart - 2) please note that there were some changes to your medications

## 2023-11-03 NOTE — Consult Note (Addendum)
 Cardiology Consultation   Patient ID: Shelley Bautista MRN: [id number]; DOB: [birth year]  Admit date: 11/02/2023 Date of Consult: 11/03/2023  PCP:  Marvine Rush, MD   [facility] Providers Cardiologist:  Alvan Carrier, MD        Patient Profile:   Shelley Bautista is a 68 y.o. female with a hx of HTN, HLD and GERD who is being seen 11/03/2023 for the evaluation of chest pain at the request of Dr. Manfred.  History of Present Illness:   [name] was examined by Dr. Alvan in 12/2019 and had a history of atypical chest pain.  Symptoms had previously been thought to be most consistent with acid reflux and no further workup was pursued at that time.  She presented to [facility] ED during the evening hours of 11/02/2023 for evaluation of chest pain.  She reports that she has been suffering more hip pain and has been on a steroid taper over the past week. Starting on Sunday, she noted her blood pressure was elevated with diastolic readings in the 90's and SBP in the 170's to 180's. She reports having chest discomfort starting on Sunday which was more of a pressure and would last for hours at a time. Pain spontaneously resolved and she had recurrent pain yesterday which lasted throughout the entire day. This was not associated with exertion or positional changes. Does report mild dyspnea yesterday. No recent palpitations, orthopnea, PND or pitting edema. She reports her pain did spontaneously resolve last night and she is pain-free currently.  Initial labs showed WBC 6.6, Hgb 14.4, platelets 382, Na+ 137, K+ 3.3 and creatinine 0.77. Initial and repeat Hs troponin values have been negative at 3. CXR with no active cardiopulmonary disease. EKG shows normal sinus rhythm, heart rate 88 with borderline LVH and no acute ST changes when compared to prior tracings.  She did receive potassium supplementation and K+ has normalized at 3.9 today.  Past Medical History:  Diagnosis Date   Atypical  chest pain    Headache    Heart murmur    a. benign -  2D Echo 01/02/18 showed EF 55-60%, grade 1 DD, mild AI, trivial MR/TR, PASP , CVP 3.    High cholesterol    Hypertension    Post-tussive syncope     Past Surgical History:  Procedure Laterality Date   BIOPSY  05/22/2021   Procedure: BIOPSY;  Surgeon: Eartha Angelia Sieving, MD;  [facility];  Service: Gastroenterology;;   COLONOSCOPY WITH PROPOFOL  N/A 05/22/2021   Procedure: COLONOSCOPY WITH PROPOFOL ;  Surgeon: Eartha Angelia Sieving, MD;  Location[facility];  Service: Gastroenterology;  Laterality: N/A;  8:15   CYSTECTOMY     [place]   ESOPHAGOGASTRODUODENOSCOPY (EGD) WITH PROPOFOL  N/A 05/22/2021   Procedure: ESOPHAGOGASTRODUODENOSCOPY (EGD) WITH PROPOFOL ;  Surgeon: Eartha Angelia Sieving, MD;  Location: [facility];  Service: Gastroenterology;  Laterality: N/A;   POLYPECTOMY  05/22/2021   Procedure: POLYPECTOMY;  Surgeon: Eartha Angelia Sieving, MD;  Location: [facility];  Service: Gastroenterology;;     Home Medications:  Prior to Admission medications   Medication Sig Start Date End Date Taking? Authorizing Provider  amLODipine  (NORVASC ) 5 MG tablet Take 1 tablet (5 mg total) by mouth daily. Patient taking differently: Take 5 mg by mouth in the morning. 02/10/18 05/20/23  Dunn, Dayna N, PA-C  nitroGLYCERIN  (NITROSTAT ) 0.4 MG SL tablet Place 1 tablet (0.4 mg total) under the tongue every 5 (five) minutes as  needed for chest pain. 01/03/18   Knapp, Iva, MD  pantoprazole  (PROTONIX ) 40 MG tablet Take 1 tablet (40 mg total) by mouth daily. Take 30-60 min before first meal of the day 12/21/22   Darlean Ozell NOVAK, MD  predniSONE  (DELTASONE ) 10 MG tablet Take  4 each am x 2 days,   2 each am x 2 days,  1 each am x 2 days and stop 12/21/22   Darlean Ozell NOVAK, MD  zolpidem (AMBIEN) 10 MG tablet Take 10 mg by mouth at bedtime as needed (insomnia (due to 3rd shift)).    [provider]    Inpatient  Medications: Scheduled Meds:  amLODipine   10 mg Oral Daily   aspirin  EC  81 mg Oral Daily   enoxaparin  (LOVENOX ) injection  40 mg Subcutaneous Q24H   pantoprazole   40 mg Oral Daily    PRN Meds: acetaminophen  **OR** acetaminophen , nitroGLYCERIN , ondansetron  **OR** ondansetron  (ZOFRAN ) IV  Allergies:   No Known Allergies  Social History:   Social History   Socioeconomic History   Marital status: Married    Spouse name: Meribeth   Number of children: 4   Years of education: GED   Highest education level: Not on file  Occupational History    Employer: [employer]    Comment: Horticulturist, Commercial  Tobacco Use   Smoking status: Never   Smokeless tobacco: Never  Vaping Use   Vaping status: Never Used  Substance and Sexual Activity   Alcohol use: Yes    Comment: one beer to help sleep about 3 days a week   Drug use: No   Sexual activity: Not on file  Other Topics Concern   Not on file  Social History Narrative   Patient lives at home with her family.   Caffeine use: 2 drinks daily    Family History:    Family History  Problem Relation Age of Onset   Cancer Mother    Other Father        blood clot     ROS:  Please see the history of present illness. All other ROS reviewed and negative.     Physical Exam/Data:   Vitals:   11/03/23 0630 11/03/23 0700 11/03/23 0800 11/03/23 0900  BP:  130/81 (!) 147/93 (!) 140/79  Pulse: 74 70 70 65  Resp: 17 12 (!) 30 (!) 25  Temp:      TempSrc:      SpO2: 94% 96% 92% 95%  Weight:      Height:       No intake or output data in the 24 hours ending 11/03/23 0936    11/02/2023    9:46 PM 12/21/2022    2:03 PM 05/22/2021    7:01 AM  Last 3 Weights  Weight (lbs) 159 lb 6.3 oz 159 lb 6.4 oz 145 lb  Weight (kg) 72.3 kg 72.303 kg 65.772 kg     Body mass index is 32.19 kg/m.  General:  Well nourished, well developed female appearing in no acute distress HEENT: normal Neck: no JVD Vascular: No carotid bruits; Distal pulses 2+  bilaterally Cardiac:  normal S1, S2; RRR; no murmur  Lungs:  clear to auscultation bilaterally, no wheezing, rhonchi or rales  Abd: soft, nontender, no hepatomegaly  Ext: no pitting edema Musculoskeletal:  No deformities, BUE and BLE strength normal and equal Skin: warm and dry  Neuro:  CNs 2-12 intact, no focal abnormalities noted Psych:  Normal affect   EKG:  The EKG  was personally reviewed and demonstrates: Normal sinus rhythm, heart rate 88 with borderline LVH and no acute ST changes when compared to prior tracings.  Telemetry:  Telemetry was personally reviewed and demonstrates: NSR, HR in 70's to 80's.   Relevant CV Studies:  Echocardiogram: 12/2017 Study Conclusions   - Left ventricle: The cavity size was normal. Wall thickness was    normal. Systolic function was normal. The estimated ejection    fraction was in the range of 55% to 60%. Wall motion was normal;    there were no regional wall motion abnormalities. Doppler    parameters are consistent with abnormal left ventricular    relaxation (grade 1 diastolic dysfunction).  - Aortic valve: Mildly calcified annulus. Trileaflet. There was    mild regurgitation.  - Mitral valve: Mildly calcified annulus. There was trivial    regurgitation.  - Right atrium: Central venous pressure (est): 3 mm Hg.  - Tricuspid valve: There was mild regurgitation.  - Pulmonary arteries: PA peak pressure: 29 mm Hg (S).  - Pericardium, extracardiac: There was no pericardial effusion.   Laboratory Data:  High Sensitivity Troponin:   Recent Labs  Lab 11/02/23 2200 11/03/23 0015  TROPONINIHS 3 3     Chemistry Recent Labs  Lab 11/02/23 2200 11/03/23 0458  NA 137 135  K 3.3* 3.9  CL 101 103  CO2 24 24  GLUCOSE 143* 118*  BUN 12 10  CREATININE 0.77 0.60  CALCIUM 9.1 8.9  MG  --  2.5*  GFRNONAA >60 >60  ANIONGAP 12 8    Recent Labs  Lab 11/03/23 0458  PROT 7.4  ALBUMIN 3.7  AST 20  ALT 36  ALKPHOS 71  BILITOT 0.6    Hematology Recent Labs  Lab 11/02/23 2200 11/03/23 0458  WBC 6.6 5.6  RBC 4.91 4.93  HGB 14.4 14.7  HCT 43.5 43.8  MCV 88.6 88.8  MCH 29.3 29.8  MCHC 33.1 33.6  RDW 14.2 14.3  PLT 382 351    Radiology/Studies:  DG Chest 2 View Result Date: 11/02/2023 CLINICAL DATA:  Chest pain. Mid chest pain and discomfort. Shortness of breath, nausea, bilateral hand numbness. Shooting pains. EXAM: CHEST - 2 VIEW COMPARISON:  11/04/2022 FINDINGS: Normal heart size and pulmonary vascularity. No focal airspace disease or consolidation in the lungs. No blunting of costophrenic angles. No pneumothorax. Mediastinal contours appear intact. Degenerative changes in the spine and shoulders. IMPRESSION: No active cardiopulmonary disease. Electronically Signed   By: Elsie Gravely M.D.   On: 11/02/2023 22:20     Assessment and Plan:   1. Chest Pain with Atypical Features - Her chest pain has atypical qualities as it has been constant for 10+ hours at a time and is not associated with exertion. Pain-free currently. BP has been elevated at times and this was in the setting of taking steroids for hip pain.  - Hs Troponin values have been negative and EKG is without acute ST changes. Will obtain an echocardiogram to assess for any structural abnormalities. If this is reassuring, can arrange for a Coronary CTA as an outpatient as she has a history of recurrent, atypical chest pain and this would provide further clarification regarding her symptoms.   2. HTN - BP was initially elevated at 163/88, improved to 140/70 this morning. Continue PTA Amlodipine  10mg  daily.   3. GERD - Continue Protonix  40mg  daily.   For questions or updates, please contact [facility] Please consult [email or web address] for contact info under  Signed, Laymon CHRISTELLA Qua, PA-C  11/03/2023 9:36 AM   Attending note:  Patient seen and examined.  I reviewed her records and discussed the case with Ms. Strader PA-C, I agree with  her above findings.  Ms. Stanis presents to the [facility], ER for evaluation of recent recurring thoracic discomfort.  She had undergone a recent steroid taper for treatment of hip pain and has had elevations in blood pressures over her baseline.  Otherwise reports compliance with her medications and no other major health change.  Under observation in the ER her symptoms have resolved without specific intervention, cardiac enzymes are normal and do not suggest ACS, ECG shows sinus rhythm with increased voltage in borderline repolarization abnormalities.  She has a history of hypertension and hyperlipidemia.  Prior evaluation for atypical chest pain by Dr. Alvan with symptoms felt to be potentially GI in etiology.  On examination she appears comfortable, reports no active symptoms.  She is afebrile, heart rate in the 70s in sinus rhythm by telemetry, recent systolics 130s to 140s.  Lungs are clear.  Cardiac exam with RRR and no gallop, 2/6 systolic murmur at the base.  Pertinent lab work includes potassium 3.9, creatinine 0.6, high-sensitivity troponin I levels of 3, normal CBC.  Chest x-ray reports no acute process.  Patient presents with atypical thoracic discomfort, recent steroid taper with elevation in blood pressure over baseline also.  She is symptom-free today and has ruled out for ACS.  Echocardiogram ordered by hospitalist team.  If no substantial abnormalities uncovered that require further inpatient workup, she can likely be discharged.  Will arrange follow-up in our office and most likely consider a coronary CTA as an outpatient as she has not undergone any prior ischemic workup.  Jayson JUDITHANN Sierras, M.D., F.A.C.C.

## 2023-11-03 NOTE — Telephone Encounter (Signed)
-----   Message from Laymon CHRISTELLA Qua sent at 11/03/2023  3:01 PM EST ----- Regarding: Coronary CTA Dr. Debera and I saw this patient in consult today. She needs a Coronary CTA for chest pain. HR was in the 70's while in the ED so can order standard dose of Lopressor . Can order the CTA under my name as I will be seeing her back in 11/2023.  Thanks,  Brittany

## 2023-11-03 NOTE — Telephone Encounter (Signed)
 Left message for patient to call and schedule coronary CT on phone [phone] Contact number [phone] is not a working number  Left message for daughter Natallia Stellmach)  at [phone] to ask patient to call Cardiology to schedule Coronary CT

## 2023-11-04 NOTE — Telephone Encounter (Signed)
 Spoke with pt in informed of CT Scan. Pt states that she picked up lopressor  and took it today at 2 pm. BP 164/97 Hr. 69. Please advise.

## 2023-11-07 MED ORDER — METOPROLOL TARTRATE 100 MG PO TABS: 100.0000 mg | ORAL_TABLET | ORAL | 0 refills | Status: AC

## 2023-11-07 NOTE — Telephone Encounter (Signed)
 Spoke with pt and encouraged to monitor BP and hr. Pt agrees to take Lopresor on the day of her CT scan.

## 2023-12-02 ENCOUNTER — Telehealth (HOSPITAL_COMMUNITY): Payer: Self-pay | Admitting: *Deleted

## 2023-12-02 NOTE — Telephone Encounter (Signed)
 Reaching out to patient to offer assistance regarding upcoming cardiac imaging study; pt verbalizes understanding of appt date/time, parking situation and where to check in, pre-test NPO status and medications ordered, and verified current allergies; name and call back number provided for further questions should they arise Johney Frame RN Navigator Cardiac Imaging [facility] [phone] office [phone] cell

## 2023-12-05 ENCOUNTER — Ambulatory Visit (HOSPITAL_COMMUNITY)
Admission: RE | Admit: 2023-12-05 | Discharge: 2023-12-05 | Disposition: A | Discharge status: Home / Self Care | Payer: Medicare (Managed Care) | Source: Ambulatory Visit | Attending: Student | Admitting: Student

## 2023-12-05 DIAGNOSIS — R079 Chest pain, unspecified: Secondary | ICD-10-CM | POA: Diagnosis not present

## 2023-12-05 DIAGNOSIS — R0600 Dyspnea, unspecified: Secondary | ICD-10-CM | POA: Insufficient documentation

## 2023-12-05 DIAGNOSIS — E785 Hyperlipidemia, unspecified: Secondary | ICD-10-CM | POA: Diagnosis not present

## 2023-12-05 DIAGNOSIS — I10 Essential (primary) hypertension: Secondary | ICD-10-CM | POA: Insufficient documentation

## 2023-12-05 DIAGNOSIS — K219 Gastro-esophageal reflux disease without esophagitis: Secondary | ICD-10-CM | POA: Diagnosis not present

## 2023-12-05 MED ORDER — NITROGLYCERIN 0.4 MG SL SUBL
0.8000 mg | SUBLINGUAL_TABLET | Freq: Once | SUBLINGUAL | Status: AC
  Administered 2023-12-05: 0.8 mg via SUBLINGUAL

## 2023-12-05 MED ORDER — NITROGLYCERIN 0.4 MG SL SUBL
SUBLINGUAL_TABLET | SUBLINGUAL | Status: AC
  Filled 2023-12-05: qty 2

## 2023-12-05 MED ORDER — IOHEXOL 350 MG/ML SOLN
95.0000 mL | Freq: Once | INTRAVENOUS | Status: AC | PRN
Start: 2023-12-05 — End: 2023-12-05
  Administered 2023-12-05: 95 mL via INTRAVENOUS

## 2023-12-06 ENCOUNTER — Encounter: Payer: Self-pay | Admitting: Student

## 2023-12-06 ENCOUNTER — Ambulatory Visit: Payer: Medicare (Managed Care) | Attending: Student | Admitting: Student

## 2023-12-06 NOTE — Progress Notes (Deleted)
   Cardiology Office Note    Date:  12/06/2023  ID:  Shelley Bautista, DOB July 07, 1956, MRN [id number] Cardiologist: Dina Rich, MD    History of Present Illness:    Shelley Bautista is a 68 y.o. female with past medical history of HTN, HLD and GERD who presents to the office today for hospital follow-up.  She most recently presented to [facility] ED in 10/2023 for evaluation of chest pain which had started a few days prior to arrival and BP had been significantly elevated when checked at home.  Troponin values were negative and EKG showed no acute ST changes. Echocardiogram showed a preserved EF of 55% with no regional wall motion abnormalities. She did have grade 1 diastolic dysfunction, mild AI and trivial MR. Was recommended to arrange for a Coronary CTA as an outpatient.  ROS: ***  Studies Reviewed:   EKG: EKG is*** ordered today and demonstrates ***   EKG Interpretation Date/Time:    Ventricular Rate:    PR Interval:    QRS Duration:    QT Interval:    QTC Calculation:   R Axis:      Text Interpretation:         Echocardiogram: 10/2023 IMPRESSIONS     1. Left ventricular ejection fraction, by estimation, is approximately  55%. The left ventricle has normal function. The left ventricle has no  regional wall motion abnormalities. There is moderate asymmetric left  ventricular hypertrophy of the basal  segment. Left ventricular diastolic parameters are consistent with Grade I  diastolic dysfunction (impaired relaxation).   2. Right ventricular systolic function is normal. The right ventricular  size is normal. There is normal pulmonary artery systolic pressure. The  estimated right ventricular systolic pressure is 22.2 mmHg.   3. The mitral valve is degenerative. Trivial mitral valve regurgitation.   4. The aortic valve is tricuspid. Aortic valve regurgitation is mild.  Aortic valve sclerosis is present, with no evidence of aortic valve  stenosis. Aortic  regurgitation PHT measures 534 msec. Aortic valve mean  gradient measures 7.0 mmHg.   5. The inferior vena cava is normal in size with greater than 50%  respiratory variability, suggesting right atrial pressure of 3 mmHg.   Comparison(s): Prior images unable to be directly viewed.    Risk Assessment/Calculations:   {Does this patient have ATRIAL FIBRILLATION?:[phone]}           Physical Exam:   VS:  There were no vitals taken for this visit.   Wt Readings from Last 3 Encounters:  11/02/23 159 lb 6.3 oz (72.3 kg)  12/21/22 159 lb 6.4 oz (72.3 kg)  05/22/21 145 lb (65.8 kg)     GEN: Well nourished, well developed in no acute distress NECK: No JVD; No carotid bruits CARDIAC: ***RRR, no murmurs, rubs, gallops RESPIRATORY:  Clear to auscultation without rales, wheezing or rhonchi  ABDOMEN: Appears non-distended. No obvious abdominal masses. EXTREMITIES: No clubbing or cyanosis. No edema.  Distal pedal pulses are 2+ bilaterally.   Assessment and Plan:   1. Chest Pain - ***  2. HTN - *** - Continue current medical therapy with Amlodipine 10 mg daily.    3. HLD - LDL was at 107 when checked last month.   Signed, Ellsworth Lennox, PA-C

## 2023-12-16 NOTE — Progress Notes (Signed)
 Associations are chest pain and dyspnea on exertion.   Thanks,  [place]

## 2024-01-12 ENCOUNTER — Other Ambulatory Visit: Payer: Self-pay | Admitting: Orthopedic Surgery

## 2024-01-13 LAB — SURGICAL PATHOLOGY

## 2024-04-13 ENCOUNTER — Encounter (INDEPENDENT_AMBULATORY_CARE_PROVIDER_SITE_OTHER): Payer: Self-pay | Admitting: *Deleted
